# Patient Record
Sex: Female | Born: 1950 | Race: White | Hispanic: No | Marital: Married | Smoking: Never smoker
Health system: Southern US, Community
[De-identification: ages and names within clinical notes are randomized; demographics above are authoritative.]

## PROBLEM LIST (undated history)

## (undated) DIAGNOSIS — Z8489 Family history of other specified conditions: Secondary | ICD-10-CM

## (undated) DIAGNOSIS — F32A Depression, unspecified: Secondary | ICD-10-CM

## (undated) DIAGNOSIS — M25569 Pain in unspecified knee: Secondary | ICD-10-CM

## (undated) DIAGNOSIS — G4730 Sleep apnea, unspecified: Secondary | ICD-10-CM

## (undated) DIAGNOSIS — F329 Major depressive disorder, single episode, unspecified: Secondary | ICD-10-CM

## (undated) DIAGNOSIS — N62 Hypertrophy of breast: Secondary | ICD-10-CM

## (undated) DIAGNOSIS — I10 Essential (primary) hypertension: Secondary | ICD-10-CM

## (undated) DIAGNOSIS — R42 Dizziness and giddiness: Secondary | ICD-10-CM

## (undated) DIAGNOSIS — Z972 Presence of dental prosthetic device (complete) (partial): Secondary | ICD-10-CM

## (undated) DIAGNOSIS — R011 Cardiac murmur, unspecified: Secondary | ICD-10-CM

## (undated) DIAGNOSIS — F419 Anxiety disorder, unspecified: Secondary | ICD-10-CM

## (undated) HISTORY — DX: Pain in unspecified knee: M25.569

## (undated) HISTORY — PX: APPENDECTOMY: SHX54

## (undated) HISTORY — PX: CHOLECYSTECTOMY: SHX55

## (undated) HISTORY — PX: ABDOMINAL HYSTERECTOMY: SHX81

## (undated) HISTORY — PX: HERNIA REPAIR: SHX51

---

## 2009-08-16 ENCOUNTER — Emergency Department: Payer: Self-pay | Admitting: Emergency Medicine

## 2011-02-23 ENCOUNTER — Ambulatory Visit: Payer: Self-pay | Admitting: Specialist

## 2011-02-23 LAB — COMPREHENSIVE METABOLIC PANEL
Alkaline Phosphatase: 64 U/L (ref 50–136)
Anion Gap: 7 (ref 7–16)
Bilirubin,Total: 0.4 mg/dL (ref 0.2–1.0)
Calcium, Total: 9.3 mg/dL (ref 8.5–10.1)
Chloride: 105 mmol/L (ref 98–107)
Co2: 32 mmol/L (ref 21–32)
Creatinine: 1.02 mg/dL (ref 0.60–1.30)
EGFR (Non-African Amer.): 59 — ABNORMAL LOW
Osmolality: 291 (ref 275–301)
Potassium: 3.9 mmol/L (ref 3.5–5.1)
SGOT(AST): 22 U/L (ref 15–37)
Sodium: 144 mmol/L (ref 136–145)

## 2011-02-23 LAB — CBC WITH DIFFERENTIAL/PLATELET
Basophil %: 0.7 %
Eosinophil #: 0.1 10*3/uL (ref 0.0–0.7)
HCT: 39.7 % (ref 35.0–47.0)
HGB: 13.1 g/dL (ref 12.0–16.0)
Lymphocyte %: 35 %
MCHC: 33 g/dL (ref 32.0–36.0)
MCV: 88 fL (ref 80–100)
Monocyte #: 0.3 10*3/uL (ref 0.0–0.7)
Monocyte %: 5.5 %
Neutrophil #: 2.9 10*3/uL (ref 1.4–6.5)
Neutrophil %: 56.8 %
RBC: 4.5 10*6/uL (ref 3.80–5.20)
WBC: 5.1 10*3/uL (ref 3.6–11.0)

## 2011-02-23 LAB — TSH: Thyroid Stimulating Horm: 2.83 u[IU]/mL

## 2011-02-23 LAB — AMYLASE: Amylase: 26 U/L (ref 25–115)

## 2011-02-23 LAB — HEPATIC FUNCTION PANEL A (ARMC): Bilirubin, Direct: <0.1 mg/dL (ref 0.00–0.20)

## 2011-02-23 LAB — MAGNESIUM: Magnesium: 1.8 mg/dL

## 2011-02-23 LAB — PROTIME-INR: Prothrombin Time: 12.3 secs (ref 11.5–14.7)

## 2011-03-07 ENCOUNTER — Ambulatory Visit: Payer: Self-pay | Admitting: Specialist

## 2011-03-13 ENCOUNTER — Ambulatory Visit: Payer: Self-pay

## 2011-05-05 ENCOUNTER — Ambulatory Visit: Payer: Self-pay | Admitting: Specialist

## 2011-05-30 ENCOUNTER — Ambulatory Visit: Payer: Self-pay | Admitting: Specialist

## 2011-05-30 LAB — POTASSIUM: Potassium: 3.6 mmol/L (ref 3.5–5.1)

## 2011-06-20 ENCOUNTER — Inpatient Hospital Stay: Payer: Self-pay | Admitting: Specialist

## 2011-06-20 HISTORY — PX: GASTRIC BYPASS: SHX52

## 2011-06-21 LAB — CBC WITH DIFFERENTIAL/PLATELET
Eosinophil %: 0 %
MCH: 29.3 pg (ref 26.0–34.0)
MCV: 90 fL (ref 80–100)
Neutrophil #: 7.3 10*3/uL — ABNORMAL HIGH (ref 1.4–6.5)
Neutrophil %: 81.2 %
Platelet: 308 10*3/uL (ref 150–440)
RDW: 14.6 % — ABNORMAL HIGH (ref 11.5–14.5)
WBC: 9 10*3/uL (ref 3.6–11.0)

## 2011-06-21 LAB — BASIC METABOLIC PANEL
Anion Gap: 8 (ref 7–16)
Calcium, Total: 8.1 mg/dL — ABNORMAL LOW (ref 8.5–10.1)
Chloride: 106 mmol/L (ref 98–107)
Co2: 28 mmol/L (ref 21–32)
EGFR (African American): >60
EGFR (Non-African Amer.): 56 — ABNORMAL LOW
Glucose: 120 mg/dL — ABNORMAL HIGH (ref 65–99)
Osmolality: 283 (ref 275–301)
Potassium: 4.2 mmol/L (ref 3.5–5.1)

## 2011-06-21 LAB — MAGNESIUM: Magnesium: 1.6 mg/dL — ABNORMAL LOW

## 2011-06-21 LAB — PHOSPHORUS: Phosphorus: 3.2 mg/dL (ref 2.5–4.9)

## 2011-06-21 LAB — ALBUMIN: Albumin: 2.9 g/dL — ABNORMAL LOW (ref 3.4–5.0)

## 2011-06-22 LAB — MAGNESIUM: Magnesium: 1.7 mg/dL — ABNORMAL LOW

## 2011-06-22 LAB — CBC WITH DIFFERENTIAL/PLATELET
Eosinophil #: 0.1 10*3/uL (ref 0.0–0.7)
HGB: 11.5 g/dL — ABNORMAL LOW (ref 12.0–16.0)
Lymphocyte %: 20.7 %
MCH: 29.1 pg (ref 26.0–34.0)
MCHC: 32.6 g/dL (ref 32.0–36.0)
Monocyte #: 0.5 x10 3/mm (ref 0.2–0.9)
Monocyte %: 6.8 %
Platelet: 291 10*3/uL (ref 150–440)
RBC: 3.96 10*6/uL (ref 3.80–5.20)

## 2011-06-22 LAB — BASIC METABOLIC PANEL
Anion Gap: 8 (ref 7–16)
BUN: 6 mg/dL — ABNORMAL LOW (ref 7–18)
Calcium, Total: 8.4 mg/dL — ABNORMAL LOW (ref 8.5–10.1)
EGFR (Non-African Amer.): >60
Glucose: 102 mg/dL — ABNORMAL HIGH (ref 65–99)
Osmolality: 285 (ref 275–301)
Potassium: 3.4 mmol/L — ABNORMAL LOW (ref 3.5–5.1)

## 2011-06-22 LAB — PHOSPHORUS: Phosphorus: 2 mg/dL — ABNORMAL LOW (ref 2.5–4.9)

## 2011-07-17 ENCOUNTER — Ambulatory Visit: Payer: Self-pay | Admitting: Specialist

## 2011-08-17 ENCOUNTER — Ambulatory Visit: Payer: Self-pay | Admitting: Specialist

## 2012-07-26 ENCOUNTER — Ambulatory Visit: Payer: Self-pay | Admitting: Oncology

## 2012-08-01 ENCOUNTER — Ambulatory Visit: Payer: Self-pay | Admitting: Oncology

## 2012-08-01 LAB — CBC CANCER CENTER
Basophil #: 0.1 x10 3/mm (ref 0.0–0.1)
Eosinophil #: 0.1 x10 3/mm (ref 0.0–0.7)
HGB: 12.3 g/dL (ref 12.0–16.0)
MCH: 30.6 pg (ref 26.0–34.0)
MCHC: 33.4 g/dL (ref 32.0–36.0)
Monocyte #: 0.3 x10 3/mm (ref 0.2–0.9)
Monocyte %: 5 %
Neutrophil %: 54.8 %
Platelet: 283 x10 3/mm (ref 150–440)
RBC: 4.02 10*6/uL (ref 3.80–5.20)
RDW: 14.3 % (ref 11.5–14.5)

## 2012-08-01 LAB — IRON AND TIBC
Iron Bind.Cap.(Total): 301 ug/dL (ref 250–450)
Iron Saturation: 32 %
Iron: 97 ug/dL (ref 50–170)
Unbound Iron-Bind.Cap.: 204 ug/dL

## 2012-08-01 LAB — FERRITIN: Ferritin (ARMC): 163 ng/mL (ref 8–388)

## 2012-08-01 LAB — APTT: Activated PTT: 31.9 secs (ref 23.6–35.9)

## 2012-08-16 ENCOUNTER — Ambulatory Visit: Payer: Self-pay | Admitting: Oncology

## 2012-09-05 LAB — FERRITIN: Ferritin (ARMC): 124 ng/mL (ref 8–388)

## 2012-09-16 ENCOUNTER — Ambulatory Visit: Payer: Self-pay | Admitting: Oncology

## 2012-11-18 ENCOUNTER — Emergency Department: Payer: Self-pay | Admitting: Emergency Medicine

## 2012-11-18 LAB — CBC
HGB: 12.5 g/dL (ref 12.0–16.0)
Platelet: 262 10*3/uL (ref 150–440)
RBC: 4.03 10*6/uL (ref 3.80–5.20)
RDW: 14.3 % (ref 11.5–14.5)
WBC: 6 10*3/uL (ref 3.6–11.0)

## 2012-11-18 LAB — URINALYSIS, COMPLETE
Glucose,UR: NEGATIVE mg/dL (ref 0–75)
Nitrite: POSITIVE
Ph: 5 (ref 4.5–8.0)
RBC,UR: 3 /HPF (ref 0–5)
Specific Gravity: 1.024 (ref 1.003–1.030)
WBC UR: 5 /HPF (ref 0–5)

## 2012-11-18 LAB — COMPREHENSIVE METABOLIC PANEL
Alkaline Phosphatase: 92 U/L (ref 50–136)
Anion Gap: 3 — ABNORMAL LOW (ref 7–16)
Bilirubin,Total: 0.4 mg/dL (ref 0.2–1.0)
Calcium, Total: 9.4 mg/dL (ref 8.5–10.1)
Chloride: 111 mmol/L — ABNORMAL HIGH (ref 98–107)
Co2: 27 mmol/L (ref 21–32)
Creatinine: 1.04 mg/dL (ref 0.60–1.30)
EGFR (African American): >60
EGFR (Non-African Amer.): 58 — ABNORMAL LOW
Glucose: 96 mg/dL (ref 65–99)
Osmolality: 284 (ref 275–301)
SGOT(AST): 27 U/L (ref 15–37)
SGPT (ALT): 30 U/L (ref 12–78)
Sodium: 141 mmol/L (ref 136–145)
Total Protein: 6.9 g/dL (ref 6.4–8.2)

## 2012-12-02 ENCOUNTER — Ambulatory Visit: Payer: Self-pay | Admitting: Surgery

## 2012-12-10 ENCOUNTER — Ambulatory Visit: Payer: Self-pay | Admitting: Surgery

## 2014-05-08 NOTE — Op Note (Signed)
PATIENT NAME:  Jeanette [name], Jeanette [name] MR#:  [id number] DATE OF BIRTH:  09/11/1950  DATE OF PROCEDURE:  12/11/2012  PREOPERATIVE DIAGNOSIS: Incisional hernia.   POSTOPERATIVE DIAGNOSIS: Incisional hernia and umbilical hernia with incarcerated fat.   PROCEDURE PERFORMED: Incisional and umbilical hernia repair.   SURGEON: Marlyce Huge, MD  ESTIMATED BLOOD LOSS: 10 mL.   COMPLICATIONS: None.   SPECIMEN: Incarcerated omentum.   ANESTHESIA: General.   INDICATION FOR SURGERY: [name] is a pleasant, 64 year old female with history of cholecystectomy, who had a painful infraumbilical bulge that appeared consistent with a hernia. I thus brought her to the operating room suite for a hernia repair.   DETAILS OF PROCEDURE: Informed consent was obtained. [name] was brought to the operating room suite. She was induced. Endotracheal tube was placed, general anesthesia was administered. Her abdomen was then prepped and draped in a standard surgical fashion. A timeout was then performed correctly identify patient name, operative site and procedure to be performed. An infraumbilical incision was made from cranial to caudal. This was deepened down to the fascia. A hard infraumbilical hernia was felt with incarcerated omentum. This was slowly reduced with some resection of incarcerated omentum. There also appeared to be a more superior hernia, which was encountered and this appeared to be against her umbilicus. Her umbilicus was then separated off her abdominal fascia and there was a small umbilical hernia. The 2 defects were evaluated and noted to be side by side. They were then connected with the defect measuring approximately 1 cm. A Ventralex patch was then placed into the defect and the defect was closed with interrupted 0 Ethibond transverse sutures, which incorporated the tails of the mesh. The tails of the mesh were then cut off. The umbilicus was then tacked to the repair with a 3-0 Vicryl. The wound  was then irrigated and closed in 3 layers,  a 3-0 Vicryl, a deep dermal and a 4-0 Monocryl subcuticular. A sterile dressing was then placed over the wound. The patient was then awoken, extubated and brought to the postanesthesia care unit. There were no immediate complications. Needle, sponge, and instrument counts were correct at the end of the procedure.   ____________________________ Glena Norfolk. Paisyn Guercio, MD cal:aw D: 12/11/2012 07:48:40 ET T: 12/11/2012 08:29:10 ET JOB#: [id number]  cc: Harrell Gave A. Juanya Villavicencio, MD, <Dictator> Floyde Parkins MD ELECTRONICALLY SIGNED 12/14/2012 19:38

## 2014-05-10 NOTE — Op Note (Signed)
PATIENT NAME:  Jeanette [name], Jeanette [name] MR#:  [id number] DATE OF BIRTH:  20-Dec-1950  DATE OF PROCEDURE:  06/20/2011  PREOPERATIVE DIAGNOSES:  1. Morbid obesity.  2. Multiple comorbid factors.   POSTOPERATIVE DIAGNOSES:  1. Morbid obesity.  2. Multiple comorbid factors.   PROCEDURES:  1. Laparoscopic Roux-en-Y gastric bypass. 2. Hiatal hernia repair.  SURGEON:  Kreg Shropshire, MD  ASSISTANT: Valaria Good,  PA-C  CLINICAL HISTORY:  See History and Physical.  DETAILS OF THE PROCEDURE:  The patient was taken to the operating room and placed on the operating table in the supine position.  Appropriate monitors and supplemental oxygen were delivered.  Broad-spectrum IV antibiotics were administered.  Sequential stockings were placed.  The abdomen was prepped and draped in the usual sterile fashion after the smooth induction of general anesthesia.  The abdomen was accessed using a 5-ml optical trocar in the left upper quadrant.  Pneumoperitoneum was established without difficulty.  Multiple other ports were placed in preparation for gastric bypass.  The small bowel was identified at the ligament of Treitz and run for 50 cm distal to that point.  It was then divided using an Echelon white load stapler reinforced with seam guard.  The Harmonic scalpel was used to take down the mesentery to its base.  A 125 cm Roux limb was then created and a side-to-side jejunojejunostomy was created in the following fashion.  A tacking stitch was used using 2-0 Surgidac suture.  An enterotomy was made in both limbs and the Echelon white load stapler was inserted to its hub, clamped, and fired for its full 6-cm length.  The ensuing defect was closed using interrupted sutures to retract the enterotomy up and stapled across with the Echelon blue-load stapler with good effect.  A baby's butt stitch was placed times one using 2-0 Surgidac suture.  A stay suture was placed anteriorly using a 2-0 Surgidac suture and the mesenteric defect was  closed using running 2-0 Surgidac suture.  The omentum was divided then in the midline to create a path for the Roux limb to be lifted and elevated up to the gastric pouch.  The liver retractor was placed and the liver was retracted anteriorly and cephalad.  The patient was placed in steep reverse Trendelenburg after the Roux limb was tacked to the underside of the stomach using a 2-0 Surgidac suture.  All structures were removed from the stomach.  The pars flaccida was taken down using a Harmonic scalpel to the second gastric vein.  At that point the Echelon gold load reinforced with seam guard was used to fire multiple times creating a small 30 to 45-ml pouch.  Hemostasis was excellent.  The posterior pouch was then freed of all fibrofatty tissue in preparation for anastomosis.  The small bowel Roux limb was then mobilized up into the upper abdomen and tacked to the left side of the pouch with an interrupted 2-0 Surgidac suture.  Enterotomies were made in the gastric pouch and in the small bowel and the Echelon blue load stapler was inserted to 28 mm and clamped.  This was then fired and the ensuing defect was closed using a layer of 2-0 Polysorb suture times one.  A 34 French blunt-tipped bougie was inserted trans-orally down past the anastomosis and the second layer of closure occurred with two running 2-0 Polysorb sutures from either side, creating a two-layer anastomosis.  The bougie was removed and the proximal Roux limb was clamped.  This was submerged underneath saline and  endoscope was placed trans-orally and guided all the way down to the anastomosis without difficulty.  The anastomosis was widely patent and hemostasis was excellent.  No leak was present under high-pressure insufflation with the proximal Roux limb clamp.  The endoscopes were removed and I re-gowned and re-gloved.  We took the omentum and draped that over the top of the anastomosis and sutured it in place using a 2-0 Surgidac suture.  The  Peterson's defect was then closed using running 2-0 Surgidac suture. A drain was placed in the left upper quadrant over the anastomosis of [place].  The liver retractor and trocars were removed without incident.  No bleeding occurred from any of these sites.  The wounds were then closed using 4-0 Vicryl and Dermabond.  The patient tolerated the procedure well, arrived in the recovery room in stable condition, and will be admitted to my care.  ADDENDUM:  The hiatal hernia was repaired in posterior fashion by mobilizing the crura posteriorly and coapting this with interrupted 0 Surgidac suture with excellent effect. ____________________________ Kreg Shropshire, MD jb:slb D: 06/20/2011 18:26:59 ET     T: 06/21/2011 09:01:19 ET       JOB#: [id number] cc: Kreg Shropshire, MD, <Dictator> Wille Glaser Langley Adie MD ELECTRONICALLY SIGNED 06/22/2011 9:30

## 2014-07-07 ENCOUNTER — Encounter: Payer: Self-pay | Admitting: Emergency Medicine

## 2014-07-07 ENCOUNTER — Emergency Department
Admission: EM | Admit: 2014-07-07 | Discharge: 2014-07-07 | Disposition: A | Discharge status: Home / Self Care | Payer: No Typology Code available for payment source | Attending: Emergency Medicine | Admitting: Emergency Medicine

## 2014-07-07 DIAGNOSIS — I10 Essential (primary) hypertension: Secondary | ICD-10-CM | POA: Insufficient documentation

## 2014-07-07 DIAGNOSIS — Y92410 Unspecified street and highway as the place of occurrence of the external cause: Secondary | ICD-10-CM | POA: Diagnosis not present

## 2014-07-07 DIAGNOSIS — S6991XA Unspecified injury of right wrist, hand and finger(s), initial encounter: Secondary | ICD-10-CM | POA: Diagnosis present

## 2014-07-07 DIAGNOSIS — S8001XA Contusion of right knee, initial encounter: Secondary | ICD-10-CM | POA: Diagnosis not present

## 2014-07-07 DIAGNOSIS — Y998 Other external cause status: Secondary | ICD-10-CM | POA: Diagnosis not present

## 2014-07-07 DIAGNOSIS — Y9389 Activity, other specified: Secondary | ICD-10-CM | POA: Diagnosis not present

## 2014-07-07 DIAGNOSIS — S63501A Unspecified sprain of right wrist, initial encounter: Secondary | ICD-10-CM

## 2014-07-07 DIAGNOSIS — Z79899 Other long term (current) drug therapy: Secondary | ICD-10-CM | POA: Insufficient documentation

## 2014-07-07 HISTORY — DX: Essential (primary) hypertension: I10
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
## 2015-10-01 ENCOUNTER — Ambulatory Visit (INDEPENDENT_AMBULATORY_CARE_PROVIDER_SITE_OTHER): Payer: BLUE CROSS/BLUE SHIELD

## 2015-10-01 ENCOUNTER — Ambulatory Visit: Payer: BLUE CROSS/BLUE SHIELD

## 2015-10-01 ENCOUNTER — Encounter: Payer: Self-pay | Admitting: *Deleted

## 2015-10-01 ENCOUNTER — Ambulatory Visit
Admission: EM | Admit: 2015-10-01 | Discharge: 2015-10-01 | Disposition: A | Discharge status: Home / Self Care | Payer: BLUE CROSS/BLUE SHIELD | Attending: Internal Medicine | Admitting: Internal Medicine

## 2015-10-01 DIAGNOSIS — S8392XA Sprain of unspecified site of left knee, initial encounter: Secondary | ICD-10-CM

## 2015-10-01 DIAGNOSIS — S8002XA Contusion of left knee, initial encounter: Secondary | ICD-10-CM
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
## 2015-10-01 NOTE — ED Provider Notes (Signed)
CSN: [id number]     Arrival date & time 10/01/15  1507 History   First MD Initiated Contact with Patient 10/01/15 1547     Chief Complaint  Patient presents with  . Knee Pain  . Leg Injury   (Consider location/radiation/quality/duration/timing/severity/associated sxs/prior Treatment) HPI History obtained from patient:  Pt presents with the cc of:  Left knee injury Duration of symptoms: 2 weeks Treatment prior to arrival: Ice and elevation and heat occasional Tylenol or ibuprofen Context: Patient stepped into a hole about 2 weeks ago twisted her left knee. Other symptoms include: Pain to walk and bruising of the lower leg Pain score: 3 FAMILY HISTORY: Hypertension    Past Medical History:  Diagnosis Date  . Hypertension    Past Surgical History:  Procedure Laterality Date  . ABDOMINAL HYSTERECTOMY    . APPENDECTOMY    . CHOLECYSTECTOMY    . GASTRIC BYPASS    . HERNIA REPAIR     History reviewed. No pertinent family history. Social History  Substance Use Topics  . Smoking status: Never Smoker  . Smokeless tobacco: Never Used  . Alcohol use No   OB History    No data available     Review of Systems  Denies: HEADACHE, NAUSEA, ABDOMINAL PAIN, CHEST PAIN, CONGESTION, DYSURIA, SHORTNESS OF BREATH  Allergies  Review of patient's allergies indicates no known allergies.  Home Medications   Prior to Admission medications   Medication Sig Start Date End Date Taking? Authorizing Provider  sertraline (ZOLOFT) 25 MG tablet Take 25 mg by mouth daily.   Yes Historical Provider, MD   Meds Ordered and Administered this Visit  Medications - No data to display  BP (!) 181/99 (BP Location: Right Arm)   Pulse 85   Temp 98.2 F (36.8 C) (Oral)   Resp 16   Ht 5\' 6"  (1.676 m)   Wt 205 lb (93 kg)   SpO2 96%   BMI 33.09 kg/m  No data found.   Physical Exam NURSES NOTES AND VITAL SIGNS REVIEWED. CONSTITUTIONAL: Well developed, well nourished, no acute distress HEENT:  normocephalic, atraumatic EYES: Conjunctiva normal NECK:normal ROM, supple, no adenopathy PULMONARY:No respiratory distress, normal effort ABDOMINAL: Soft, ND, NT BS+, No CVAT MUSCULOSKELETAL: Normal ROM of all extremities except left knee. Swollen, bruised,   SKIN: warm and dry without rash PSYCHIATRIC: Mood and affect, behavior are normal  Urgent Care Course   Clinical Course    Procedures (including critical care time)  Labs Review Labs Reviewed - No data to display  Imaging Review Dg Knee Complete 4 Views Left  Result Date: 10/01/2015 CLINICAL DATA:  Patient stepped into a hole 2 weeks prior. Anterior knee pain. Initial encounter. EXAM: LEFT KNEE - COMPLETE 4+ VIEW COMPARISON:  None. FINDINGS: Normal anatomic alignment. No evidence for acute fracture or dislocation. No joint effusion. Regional soft tissues are unremarkable. IMPRESSION: No acute osseous abnormality. Electronically Signed   By: Lovey Newcomer M.D.   On: 10/01/2015 16:16    Reviewed with patient and her husband prior to discharge.  Visual Acuity Review  Right Eye Distance:   Left Eye Distance:   Bilateral Distance:    Right Eye Near:   Left Eye Near:    Bilateral Near:         MDM   1. Left knee sprain, initial encounter   2. Knee contusion, left, initial encounter     Patient is reassured that there are no issues that require transfer to higher level of  care at this time or additional tests. Patient is advised to continue home symptomatic treatment. Patient is advised that if there are new or worsening symptoms to attend the emergency department, contact primary care provider, or return to UC. Instructions of care provided discharged home in stable condition.    THIS NOTE WAS GENERATED USING A VOICE RECOGNITION SOFTWARE PROGRAM. ALL REASONABLE EFFORTS  WERE MADE TO PROOFREAD THIS DOCUMENT FOR ACCURACY.  I have verbally reviewed the discharge instructions with the patient. A printed AVS was given  to the patient.  All questions were answered prior to discharge.      Konrad Felix, [place] 10/01/15 [phone]

## 2015-10-01 NOTE — Discharge Instructions (Signed)
THERE IS NO SIGNS OF FRACTURE IN YOUR LEG.  YOU CAN START USING MOIST HEAT.  CONTINUE ACTIVITY AS TOLERATED.   FOLLOW UP WITH DR. Mack Guise IF NOT STEADILY GETTING BETTER IT MAY TAKE SEVERAL WEEKS FOR THE BRUISING TO RESOLVE.  IF THERE IS ANY CHANGE TO XRAY READING THAT CHANGES MANAGEMENT WE WILL CALL YOU TO DISCUSS.

## 2015-12-20 ENCOUNTER — Ambulatory Visit: Payer: Self-pay | Admitting: Family Medicine

## 2016-06-23 ENCOUNTER — Ambulatory Visit: Payer: Self-pay | Admitting: Family Medicine

## 2016-07-24 ENCOUNTER — Encounter: Payer: Self-pay | Admitting: Family Medicine

## 2016-07-24 ENCOUNTER — Ambulatory Visit (INDEPENDENT_AMBULATORY_CARE_PROVIDER_SITE_OTHER): Payer: BLUE CROSS/BLUE SHIELD | Admitting: Family Medicine

## 2016-07-24 VITALS — BP 132/82 | HR 75 | Resp 16 | Ht 66.0 in | Wt 220.0 lb

## 2016-07-24 DIAGNOSIS — F32A Depression, unspecified: Secondary | ICD-10-CM | POA: Insufficient documentation

## 2016-07-24 DIAGNOSIS — E669 Obesity, unspecified: Secondary | ICD-10-CM

## 2016-07-24 DIAGNOSIS — L509 Urticaria, unspecified: Secondary | ICD-10-CM | POA: Diagnosis not present

## 2016-07-24 DIAGNOSIS — K644 Residual hemorrhoidal skin tags: Secondary | ICD-10-CM

## 2016-07-24 DIAGNOSIS — R2681 Unsteadiness on feet: Secondary | ICD-10-CM

## 2016-07-24 DIAGNOSIS — Z1231 Encounter for screening mammogram for malignant neoplasm of breast: Secondary | ICD-10-CM | POA: Diagnosis not present

## 2016-07-24 DIAGNOSIS — F3342 Major depressive disorder, recurrent, in full remission: Secondary | ICD-10-CM

## 2016-07-24 DIAGNOSIS — Z1211 Encounter for screening for malignant neoplasm of colon: Secondary | ICD-10-CM

## 2016-07-24 DIAGNOSIS — R197 Diarrhea, unspecified: Secondary | ICD-10-CM | POA: Diagnosis not present

## 2016-07-24 DIAGNOSIS — Z1239 Encounter for other screening for malignant neoplasm of breast: Secondary | ICD-10-CM

## 2016-07-24 DIAGNOSIS — Z9884 Bariatric surgery status: Secondary | ICD-10-CM

## 2016-07-24 DIAGNOSIS — Z23 Encounter for immunization: Secondary | ICD-10-CM

## 2016-07-24 DIAGNOSIS — F329 Major depressive disorder, single episode, unspecified: Secondary | ICD-10-CM | POA: Insufficient documentation

## 2016-07-24 MED ORDER — ZOSTER VAC RECOMB ADJUVANTED 50 MCG/0.5ML IM SUSR: 0.5000 mL | Freq: Once | INTRAMUSCULAR | 1 refills | Status: AC

## 2016-07-24 MED ORDER — HYDROCORTISONE 2.5 % RE CREA: 1.0000 "application " | TOPICAL_CREAM | Freq: Two times a day (BID) | RECTAL | 2 refills | Status: DC | PRN

## 2016-07-24 MED ORDER — SERTRALINE HCL 50 MG PO TABS: 50.0000 mg | ORAL_TABLET | Freq: Every day | ORAL | 3 refills | Status: DC

## 2016-07-24 NOTE — Progress Notes (Signed)
Date:  07/24/2016   Name:  Jeanette [name]   DOB:  [birth year]   MRN:  [id number]  PCP:  Adline Potter, MD    Chief Complaint: Establish Care; Leg Swelling (Right leg ); Edema (Right arm in elbow area swelling ); and Hot Flashes   History of Present Illness:  This is a 66 y.o. female seen for initial visit. S/p hysterectomy, appy, GB surgery, gastric bypass 2013. Lost 150# but has regained 25, on salad diet with protein shake. Hx HTN off meds since bypass. C/o frequent diarrhea since GB surgery. On Zoloft x ys for depression/anxiety, feels working well. Claritin daily controls urticaria. Has ext hemorrhoids, will discuss with GI. Occ RLE edema improves overnight. Father died stomach ca 96, mother died sepsis 49, half brother died throat ca 13, half brother with COPD (smoker). Hx colon polyps no colonoscopy in over 10 yrs, no mammogram in over 10 yrs, Pap 3 yrs ago normal, none abnormal. Tdap 2011, no pneumo or zoster imms.  Review of Systems:  Review of Systems  Constitutional: Negative for chills and fever.  HENT: Negative for ear pain, sinus pain, sore throat and trouble swallowing.   Eyes: Negative for pain.  Respiratory: Negative for cough and shortness of breath.   Cardiovascular: Negative for chest pain and palpitations.  Gastrointestinal: Negative for abdominal pain.  Endocrine: Negative for polydipsia and polyuria.  Genitourinary: Negative for difficulty urinating and vaginal bleeding.  Musculoskeletal: Negative for joint swelling.  Neurological: Negative for syncope and light-headedness.  Hematological: Negative for adenopathy.    Patient Active Problem List   Diagnosis Date Noted  . Depression 07/24/2016  . History of gastric bypass 07/24/2016  . Gait instability 07/24/2016  . Diarrhea 07/24/2016  . Urticaria 07/24/2016  . External hemorrhoids 07/24/2016  . Obesity (BMI 30-39.9) 07/24/2016    Prior to Admission medications   Medication Sig Start Date End Date Taking?  Authorizing Provider  Loperamide HCl (ANTI-DIARRHEAL PO) Take by mouth.   Yes [provider]  loratadine (ALLERGY) 10 MG tablet Take 10 mg by mouth daily.   Yes [provider]  Multiple Vitamins-Minerals (CENTRUM SILVER PO) Take by mouth.   Yes [provider]  sertraline (ZOLOFT) 50 MG tablet Take 1 tablet (50 mg total) by mouth daily. 07/24/16  Yes Armya Westerhoff, Gwyndolyn Saxon, MD  hydrocortisone (ANUSOL-HC) 2.5 % rectal cream Place 1 application rectally 2 (two) times daily as needed for hemorrhoids or anal itching. 07/24/16   Carisma Troupe, Gwyndolyn Saxon, MD  Zoster Vac Recomb Adjuvanted [facility]) injection Inject 0.5 mLs into the muscle once. 07/24/16 07/24/16  Adline Potter, MD    No Known Allergies  Past Surgical History:  Procedure Laterality Date  . ABDOMINAL HYSTERECTOMY    . APPENDECTOMY    . CHOLECYSTECTOMY    . GASTRIC BYPASS    . HERNIA REPAIR      Social History  Substance Use Topics  . Smoking status: Never Smoker  . Smokeless tobacco: Never Used  . Alcohol use No    Family History  Problem Relation Age of Onset  . Cancer Father        stomach    Medication list has been reviewed and updated.  Physical Examination: BP 132/82 (BP Location: Left Wrist, Patient Position: Sitting, Cuff Size: Normal)   Pulse 75   Resp 16   Ht 5\' 6"  (1.676 m)   Wt 220 lb (99.8 kg)   SpO2 95%   BMI 35.51 kg/m   Physical Exam  Constitutional: She is  oriented to person, place, and time. She appears well-developed and well-nourished.  HENT:  Head: Normocephalic and atraumatic.  Left Ear: External ear normal.  Nose: Nose normal.  Mouth/Throat: Oropharynx is clear and moist.  TMs clear  Eyes: Conjunctivae and EOM are normal. Pupils are equal, round, and reactive to light.  Neck: Neck supple. No thyromegaly present.  Cardiovascular: Normal rate, regular rhythm, normal heart sounds and intact distal pulses.   Pulmonary/Chest: Effort normal and breath sounds normal.  Abdominal:  Soft. She exhibits no distension and no mass. There is no tenderness.  Musculoskeletal: She exhibits no edema.  Lymphadenopathy:    She has no cervical adenopathy.  Neurological: She is alert and oriented to person, place, and time. Coordination normal.  Romberg and gait sl unsteady SLUMS 28/30  Skin: Skin is warm and dry.  Psychiatric: She has a normal mood and affect. Her behavior is normal.  GDS 2/15  Nursing note and vitals reviewed.   Assessment and Plan:  1. Recurrent major depressive disorder, in full remission (HCC) Refill Zoloft 50 mg daily - Comprehensive Metabolic Panel (CMET) - CBC - TSH - B12 - Hepatitis C Antibody  2. Gait instability Check labs, consider PT referral  3. External hemorrhoids Anusol HC cream prn, GI to eval  4. Urticaria Well controlled on Claritin daily  5. Diarrhea, unspecified type Cont Lomotil prn, Gi to eval  6. Obesity (BMI 30-39.9) S/p gastric bypass, exercise/weight loss diacussed - Lipid Profile - Vitamin D (25 hydroxy)  7. History of gastric bypass Cont mvit  8. Screening for breast cancer - MM Digital Diagnostic Bilat; Future  9. Screening for colon cancer - Ambulatory referral to Gastroenterology  10. Need for pneumococcal vaccination - Pneumococcal conjugate vaccine 13-valent  11. Need for zoster vaccination - Zoster Vac Recomb Adjuvanted [facility]) injection; Inject 0.5 mLs into the muscle once.  Dispense: 0.5 mL; Refill: 1  Return in about 4 weeks (around 08/21/2016).   45 mins spent with pt over half in counseling  Satira Anis. [facility]  07/24/2016

## 2016-07-25 ENCOUNTER — Encounter: Payer: Self-pay | Admitting: Family Medicine

## 2016-07-25 ENCOUNTER — Other Ambulatory Visit: Payer: Self-pay | Admitting: Family Medicine

## 2016-07-25 DIAGNOSIS — E559 Vitamin D deficiency, unspecified: Secondary | ICD-10-CM | POA: Insufficient documentation

## 2016-07-25 LAB — COMPREHENSIVE METABOLIC PANEL
ALT: 17 IU/L (ref 0–32)
AST: 24 IU/L (ref 0–40)
Albumin/Globulin Ratio: 1.6 (ref 1.2–2.2)
Albumin: 4.3 g/dL (ref 3.6–4.8)
Alkaline Phosphatase: 93 IU/L (ref 39–117)
BUN/Creatinine Ratio: 36 — ABNORMAL HIGH (ref 12–28)
BUN: 30 mg/dL — ABNORMAL HIGH (ref 8–27)
Bilirubin Total: 0.4 mg/dL (ref 0.0–1.2)
CALCIUM: 9.5 mg/dL (ref 8.7–10.3)
CO2: 22 mmol/L (ref 20–29)
CREATININE: 0.84 mg/dL (ref 0.57–1.00)
Chloride: 104 mmol/L (ref 96–106)
GFR calc Af Amer: 85 mL/min/{1.73_m2} (ref >59)
GFR calc non Af Amer: 74 mL/min/{1.73_m2} (ref >59)
GLOBULIN, TOTAL: 2.7 g/dL (ref 1.5–4.5)
Glucose: 88 mg/dL (ref 65–99)
Potassium: 4.2 mmol/L (ref 3.5–5.2)
Sodium: 143 mmol/L (ref 134–144)
TOTAL PROTEIN: 7 g/dL (ref 6.0–8.5)

## 2016-07-25 LAB — CBC
HEMOGLOBIN: 13.7 g/dL (ref 11.1–15.9)
Hematocrit: 42 % (ref 34.0–46.6)
MCH: 29.9 pg (ref 26.6–33.0)
MCHC: 32.6 g/dL (ref 31.5–35.7)
MCV: 92 fL (ref 79–97)
Platelets: 294 10*3/uL (ref 150–379)
RBC: 4.58 x10E6/uL (ref 3.77–5.28)
RDW: 14.5 % (ref 12.3–15.4)
WBC: 6.2 10*3/uL (ref 3.4–10.8)

## 2016-07-25 LAB — LIPID PANEL
CHOL/HDL RATIO: 2.3 ratio (ref 0.0–4.4)
Cholesterol, Total: 183 mg/dL (ref 100–199)
HDL: 80 mg/dL (ref >39)
LDL CALC: 87 mg/dL (ref 0–99)
Triglycerides: 82 mg/dL (ref 0–149)
VLDL CHOLESTEROL CAL: 16 mg/dL (ref 5–40)

## 2016-07-25 LAB — TSH: TSH: 3.14 u[IU]/mL (ref 0.450–4.500)

## 2016-07-25 LAB — HEPATITIS C ANTIBODY: Hep C Virus Ab: <0.1 s/co ratio (ref 0.0–0.9)

## 2016-07-25 LAB — VITAMIN B12: VITAMIN B 12: 376 pg/mL (ref 232–1245)

## 2016-07-25 LAB — VITAMIN D 25 HYDROXY (VIT D DEFICIENCY, FRACTURES): Vit D, 25-Hydroxy: 24.7 ng/mL — ABNORMAL LOW (ref 30.0–100.0)

## 2016-07-25 MED ORDER — VITAMIN D3 25 MCG (1000 UT) PO CAPS: 1.0000 | ORAL_CAPSULE | Freq: Every day | ORAL | Status: DC

## 2016-07-25 NOTE — Progress Notes (Signed)
Vit D level low, begin supplement

## 2016-07-27 ENCOUNTER — Encounter: Payer: Self-pay | Admitting: Family Medicine

## 2016-07-27 NOTE — Telephone Encounter (Signed)
Pt question

## 2016-08-10 ENCOUNTER — Other Ambulatory Visit: Payer: Self-pay

## 2016-08-10 ENCOUNTER — Telehealth: Payer: Self-pay

## 2016-08-10 DIAGNOSIS — Z1211 Encounter for screening for malignant neoplasm of colon: Secondary | ICD-10-CM

## 2016-08-10 DIAGNOSIS — Z86010 Personal history of colonic polyps: Secondary | ICD-10-CM

## 2016-08-10 DIAGNOSIS — Z800 Family history of malignant neoplasm of digestive organs: Secondary | ICD-10-CM

## 2016-08-10 NOTE — Telephone Encounter (Signed)
Gastroenterology Pre-Procedure Review  Request Date: 09/04/16 Requesting Physician: Dr. Allen Norris  PATIENT REVIEW QUESTIONS: The patient responded to the following health history questions as indicated:    1. Are you having any GI issues? yes (Hemrrhoids) 2. Do you have a personal history of Polyps? yes (doesnt recall last colonoscopy) 3. Do you have a family history of Colon Cancer or Polyps? yes (dad colon cancer) 4. Diabetes Mellitus? no 5. Joint replacements in the past 12 months?no 6. Major health problems in the past 3 months?no 7. Any artificial heart valves, MVP, or defibrillator?no    MEDICATIONS & ALLERGIES:    Patient reports the following regarding taking any anticoagulation/antiplatelet therapy:   Plavix, Coumadin, Eliquis, Xarelto, Lovenox, Pradaxa, Brilinta, or Effient? no Aspirin? no  Patient confirms/reports the following medications:  Current Outpatient Prescriptions  Medication Sig Dispense Refill  . Cholecalciferol (VITAMIN D3) 1000 units CAPS Take 1 capsule (1,000 Units total) by mouth daily. 60 capsule   . hydrocortisone (ANUSOL-HC) 2.5 % rectal cream Place 1 application rectally 2 (two) times daily as needed for hemorrhoids or anal itching. 30 g 2  . Loperamide HCl (ANTI-DIARRHEAL PO) Take by mouth.    . loratadine (ALLERGY) 10 MG tablet Take 10 mg by mouth daily.    . Multiple Vitamins-Minerals (CENTRUM SILVER PO) Take by mouth.    . sertraline (ZOLOFT) 50 MG tablet Take 1 tablet (50 mg total) by mouth daily. 90 tablet 3   No current facility-administered medications for this visit.     Patient confirms/reports the following allergies:  No Known Allergies  No orders of the defined types were placed in this encounter.   AUTHORIZATION INFORMATION Primary Insurance: 1D#: Group #:  Secondary Insurance: 1D#: Group #:  SCHEDULE INFORMATION: Date: 09/04/16 Time: Location:[facility]

## 2016-08-25 ENCOUNTER — Ambulatory Visit (INDEPENDENT_AMBULATORY_CARE_PROVIDER_SITE_OTHER): Payer: PPO | Admitting: Family Medicine

## 2016-08-25 ENCOUNTER — Encounter: Payer: Self-pay | Admitting: Family Medicine

## 2016-08-25 VITALS — BP 132/86 | HR 74 | Resp 16 | Ht 66.0 in | Wt 213.0 lb

## 2016-08-25 DIAGNOSIS — L509 Urticaria, unspecified: Secondary | ICD-10-CM | POA: Diagnosis not present

## 2016-08-25 DIAGNOSIS — E559 Vitamin D deficiency, unspecified: Secondary | ICD-10-CM

## 2016-08-25 DIAGNOSIS — E669 Obesity, unspecified: Secondary | ICD-10-CM | POA: Diagnosis not present

## 2016-08-25 DIAGNOSIS — F3341 Major depressive disorder, recurrent, in partial remission: Secondary | ICD-10-CM

## 2016-08-25 DIAGNOSIS — K644 Residual hemorrhoidal skin tags: Secondary | ICD-10-CM

## 2016-08-25 DIAGNOSIS — R2681 Unsteadiness on feet: Secondary | ICD-10-CM

## 2016-08-25 MED ORDER — SERTRALINE HCL 100 MG PO TABS: 100.0000 mg | ORAL_TABLET | Freq: Every day | ORAL | 3 refills | Status: DC

## 2016-08-25 NOTE — Progress Notes (Signed)
Date:  08/25/2016   Name:  Jeanette [name]   DOB:  [birth year]   MRN:  [id number]  PCP:  Adline Potter, MD    Chief Complaint: Depression (Feeling good no depression symptoms ) and Jaw Pain (radiates on L and R side down neck and sometimes back. )   History of Present Illness:  This is a 66 y.o. female seen in one month f/u from initial visit. Feels Zoloft dose too low and would like to increase, several recent deaths in family. Walking more, doesn't think she needs to see PT for balance training, no recent falls. Claritin daily controlled urticaria. Seeing GI 09/04/16 for ext hemorrhoids and colonoscopy. Weight down 7#. C/o two episodes jaw pain over past two weeks while eating, lasted less than a minute, non-exertional. Gets lightheaded occasionally when standing x yrs. Taking vit D supp.  Review of Systems:  Review of Systems  Constitutional: Negative for chills and fever.  HENT: Negative for ear pain, facial swelling and sore throat.   Respiratory: Negative for cough and shortness of breath.   Cardiovascular: Negative for chest pain and leg swelling.  Gastrointestinal: Negative for nausea.  Neurological: Negative for syncope.    Patient Active Problem List   Diagnosis Date Noted  . Vitamin D deficiency 07/25/2016  . Depression 07/24/2016  . History of gastric bypass 07/24/2016  . Gait instability 07/24/2016  . Diarrhea 07/24/2016  . Urticaria 07/24/2016  . External hemorrhoids 07/24/2016  . Obesity (BMI 30-39.9) 07/24/2016    Prior to Admission medications   Medication Sig Start Date End Date Taking? Authorizing Provider  Cholecalciferol (VITAMIN D3) 1000 units CAPS Take 1 capsule (1,000 Units total) by mouth daily. 07/25/16  Yes Elody Kleinsasser, Gwyndolyn Saxon, MD  hydrocortisone (ANUSOL-HC) 2.5 % rectal cream Place 1 application rectally 2 (two) times daily as needed for hemorrhoids or anal itching. 07/24/16  Yes Kenae Lindquist, Gwyndolyn Saxon, MD  Loperamide HCl (ANTI-DIARRHEAL PO) Take by mouth.   Yes  [provider]  loratadine (ALLERGY) 10 MG tablet Take 10 mg by mouth daily.   Yes [provider]  Multiple Vitamins-Minerals (CENTRUM SILVER PO) Take by mouth.   Yes [provider]  sertraline (ZOLOFT) 100 MG tablet Take 1 tablet (100 mg total) by mouth daily. 08/25/16  Yes Quin Mathenia, Gwyndolyn Saxon, MD    No Known Allergies  Past Surgical History:  Procedure Laterality Date  . ABDOMINAL HYSTERECTOMY    . APPENDECTOMY    . CHOLECYSTECTOMY    . GASTRIC BYPASS    . HERNIA REPAIR      Social History  Substance Use Topics  . Smoking status: Never Smoker  . Smokeless tobacco: Never Used  . Alcohol use No    Family History  Problem Relation Age of Onset  . Cancer Father        stomach    Medication list has been reviewed and updated.  Physical Examination: BP 132/86   Pulse 74   Resp 16   Ht 5\' 6"  (1.676 m)   Wt 213 lb (96.6 kg)   SpO2 96%   BMI 34.38 kg/m   Physical Exam  Constitutional: She appears well-developed and well-nourished.  HENT:  Mouth/Throat: Oropharynx is clear and moist.  No TMJ tenderness  Cardiovascular: Normal rate, regular rhythm and normal heart sounds.   Pulmonary/Chest: Effort normal and breath sounds normal.  Musculoskeletal: She exhibits no edema.  Neurological: She is alert.  Skin: Skin is warm and dry.  Psychiatric: Her behavior is normal.  Flat affect  Nursing note and vitals reviewed.   Assessment and Plan:  1. Recurrent major depressive disorder, in partial remission (HCC) Increase Zoloft to 100 mg daily, rx sent  2. Obesity (BMI 30-39.9) Weight down 7#, continue exercise/weight loss  3. Urticaria Well controlled on Claritin  4. External hemorrhoids Cont Anusol HC prn, GI to eval 09/04/16  5. Gait instability Feels improved with walking, declines PT referral  6. Vitamin D deficiency On supplement, consider recheck level next visit  Return in about 6 months (around 02/25/2017).  Satira Anis. [facility]  08/25/2016

## 2016-08-28 ENCOUNTER — Encounter: Payer: Self-pay | Admitting: *Deleted

## 2016-09-01 NOTE — Discharge Instructions (Signed)
General Anesthesia, Adult, Care After °These instructions provide you with information about caring for yourself after your procedure. Your health care provider may also give you more specific instructions. Your treatment has been planned according to current medical practices, but problems sometimes occur. Call your health care provider if you have any problems or questions after your procedure. °What can I expect after the procedure? °After the procedure, it is common to have: °· Vomiting. °· A sore throat. °· Mental slowness. ° °It is common to feel: °· Nauseous. °· Cold or shivery. °· Sleepy. °· Tired. °· Sore or achy, even in parts of your body where you did not have surgery. ° °Follow these instructions at home: °For at least 24 hours after the procedure: °· Do not: °? Participate in activities where you could fall or become injured. °? Drive. °? Use heavy machinery. °? Drink alcohol. °? Take sleeping pills or medicines that cause drowsiness. °? Make important decisions or sign legal documents. °? Take care of children on your own. °· Rest. °Eating and drinking °· If you vomit, drink water, juice, or soup when you can drink without vomiting. °· Drink enough fluid to keep your urine clear or pale yellow. °· Make sure you have [place] or no nausea before eating solid foods. °· Follow the diet recommended by your health care provider. °General instructions °· Have a responsible adult stay with you until you are awake and alert. °· Return to your normal activities as told by your health care provider. Ask your health care provider what activities are safe for you. °· Take over-the-counter and prescription medicines only as told by your health care provider. °· If you smoke, do not smoke without supervision. °· Keep all follow-up visits as told by your health care provider. This is important. °Contact a health care provider if: °· You continue to have nausea or vomiting at home, and medicines are not helpful. °· You  cannot drink fluids or start eating again. °· You cannot urinate after 8-12 hours. °· You develop a skin rash. °· You have fever. °· You have increasing redness at the site of your procedure. °Get help right away if: °· You have difficulty breathing. °· You have chest pain. °· You have unexpected bleeding. °· You feel that you are having a life-threatening or urgent problem. °This information is not intended to replace advice given to you by your health care provider. Make sure you discuss any questions you have with your health care provider. °Document Released: 04/10/2000 Document Revised: 06/07/2015 Document Reviewed: 12/17/2014 °Elsevier Interactive Patient Education © 2018 [employer] Inc[employer] ° °

## 2016-09-04 ENCOUNTER — Ambulatory Visit: Payer: PPO | Admitting: Anesthesiology

## 2016-09-04 ENCOUNTER — Encounter
Admission: RE | Disposition: A | Discharge status: Home / Self Care | Payer: Self-pay | Source: Ambulatory Visit | Attending: Gastroenterology

## 2016-09-04 ENCOUNTER — Ambulatory Visit
Admission: RE | Admit: 2016-09-04 | Discharge: 2016-09-04 | Disposition: A | Discharge status: Home / Self Care | Payer: PPO | Source: Ambulatory Visit | Attending: Gastroenterology | Admitting: Gastroenterology

## 2016-09-04 DIAGNOSIS — D120 Benign neoplasm of cecum: Secondary | ICD-10-CM | POA: Diagnosis not present

## 2016-09-04 DIAGNOSIS — Z79899 Other long term (current) drug therapy: Secondary | ICD-10-CM | POA: Insufficient documentation

## 2016-09-04 DIAGNOSIS — Z800 Family history of malignant neoplasm of digestive organs: Secondary | ICD-10-CM | POA: Diagnosis not present

## 2016-09-04 DIAGNOSIS — Z90710 Acquired absence of both cervix and uterus: Secondary | ICD-10-CM | POA: Diagnosis not present

## 2016-09-04 DIAGNOSIS — D490 Neoplasm of unspecified behavior of digestive system: Secondary | ICD-10-CM | POA: Diagnosis not present

## 2016-09-04 DIAGNOSIS — Z9884 Bariatric surgery status: Secondary | ICD-10-CM | POA: Insufficient documentation

## 2016-09-04 DIAGNOSIS — K5730 Diverticulosis of large intestine without perforation or abscess without bleeding: Secondary | ICD-10-CM | POA: Diagnosis not present

## 2016-09-04 DIAGNOSIS — K641 Second degree hemorrhoids: Secondary | ICD-10-CM | POA: Diagnosis not present

## 2016-09-04 DIAGNOSIS — Z9049 Acquired absence of other specified parts of digestive tract: Secondary | ICD-10-CM | POA: Insufficient documentation

## 2016-09-04 DIAGNOSIS — Z9889 Other specified postprocedural states: Secondary | ICD-10-CM | POA: Insufficient documentation

## 2016-09-04 DIAGNOSIS — Z1211 Encounter for screening for malignant neoplasm of colon: Secondary | ICD-10-CM

## 2016-09-04 DIAGNOSIS — D124 Benign neoplasm of descending colon: Secondary | ICD-10-CM

## 2016-09-04 HISTORY — DX: Dizziness and giddiness: R42

## 2016-09-04 HISTORY — PX: POLYPECTOMY: SHX5525

## 2016-09-04 HISTORY — DX: Presence of dental prosthetic device (complete) (partial): Z97.2

## 2016-09-04 HISTORY — DX: Family history of other specified conditions: Z84.89

## 2016-09-04 HISTORY — DX: Cardiac murmur, unspecified: R01.1

## 2016-09-04 HISTORY — PX: COLONOSCOPY WITH PROPOFOL: SHX5780

## 2016-09-04 SURGERY — COLONOSCOPY WITH PROPOFOL
Anesthesia: General

## 2016-09-04 MED ORDER — LIDOCAINE HCL (CARDIAC) 20 MG/ML IV SOLN
INTRAVENOUS | Status: DC | PRN
  Administered 2016-09-04: 50 mg via INTRAVENOUS

## 2016-09-04 MED ORDER — STERILE WATER FOR IRRIGATION IR SOLN
Status: DC | PRN
  Administered 2016-09-04: 11:00:00

## 2016-09-04 MED ORDER — PROPOFOL 10 MG/ML IV BOLUS
INTRAVENOUS | Status: DC | PRN
  Administered 2016-09-04: 50 mg via INTRAVENOUS
  Administered 2016-09-04: 30 mg via INTRAVENOUS
  Administered 2016-09-04: 100 mg via INTRAVENOUS
  Administered 2016-09-04: 20 mg via INTRAVENOUS
  Administered 2016-09-04: 50 mg via INTRAVENOUS
  Administered 2016-09-04: 30 mg via INTRAVENOUS
  Administered 2016-09-04: 20 mg via INTRAVENOUS

## 2016-09-04 MED ORDER — ONDANSETRON HCL 4 MG/2ML IJ SOLN: 4.0000 mg | Freq: Once | INTRAMUSCULAR | Status: DC | PRN

## 2016-09-04 MED ORDER — LACTATED RINGERS IV SOLN
INTRAVENOUS | Status: DC
  Administered 2016-09-04: 10:00:00 via INTRAVENOUS

## 2016-09-04 SURGICAL SUPPLY — 23 items
CANISTER SUCT 1200ML W/VALVE (MISCELLANEOUS) ×3 IMPLANT
CLIP HMST 235XBRD CATH ROT (MISCELLANEOUS) ×2 IMPLANT
CLIP RESOLUTION 360 11X235 (MISCELLANEOUS) ×1
FCP ESCP3.2XJMB 240X2.8X (MISCELLANEOUS)
FORCEPS BIOP RAD 4 LRG CAP 4 (CUTTING FORCEPS) ×3 IMPLANT
FORCEPS BIOP RJ4 240 W/NDL (MISCELLANEOUS)
FORCEPS ESCP3.2XJMB 240X2.8X (MISCELLANEOUS) IMPLANT
GOWN CVR UNV OPN BCK APRN NK (MISCELLANEOUS) ×4 IMPLANT
GOWN ISOL THUMB LOOP REG UNIV (MISCELLANEOUS) ×2
INJECTOR VARIJECT VIN23 (MISCELLANEOUS) IMPLANT
KIT DEFENDO VALVE AND CONN (KITS) IMPLANT
KIT ENDO PROCEDURE OLY (KITS) ×3 IMPLANT
MARKER SPOT ENDO TATTOO 5ML (MISCELLANEOUS) IMPLANT
PAD GROUND ADULT SPLIT (MISCELLANEOUS) IMPLANT
PROBE APC STR FIRE (PROBE) IMPLANT
RETRIEVER NET ROTH 2.5X230 LF (MISCELLANEOUS) IMPLANT
SNARE SHORT THROW 13M SML OVAL (MISCELLANEOUS) IMPLANT
SNARE SHORT THROW 30M LRG OVAL (MISCELLANEOUS) IMPLANT
SNARE SNG USE RND 15MM (INSTRUMENTS) IMPLANT
SPOT EX ENDOSCOPIC TATTOO (MISCELLANEOUS)
TRAP ETRAP POLY (MISCELLANEOUS) IMPLANT
VARIJECT INJECTOR VIN23 (MISCELLANEOUS)
WATER STERILE IRR 250ML POUR (IV SOLUTION) ×3 IMPLANT

## 2016-09-04 NOTE — Op Note (Signed)
[facility] Gastroenterology Patient Name: Jeanette [name] Procedure Date: 09/04/2016 10:26 AM MRN: [id number] Account #: [id number] Date of Birth: [birth year] Admit Type: Outpatient Age: 66 Room: [facility] OR ROOM 01 Gender: Female Note Status: Finalized Procedure:            Colonoscopy Indications:          Screening for colorectal malignant neoplasm Providers:            Lucilla Lame MD, MD Referring MD:         Satira Anis. Plonk, MD (Referring MD) Medicines:            Propofol per Anesthesia Complications:        No immediate complications. Procedure:            Pre-Anesthesia Assessment:                       - Prior to the procedure, a History and Physical was                        performed, and patient medications and allergies were                        reviewed. The patient's tolerance of previous                        anesthesia was also reviewed. The risks and benefits of                        the procedure and the sedation options and risks were                        discussed with the patient. All questions were                        answered, and informed consent was obtained. Prior                        Anticoagulants: The patient has taken no previous                        anticoagulant or antiplatelet agents. ASA Grade                        Assessment: II - A patient with mild systemic disease.                        After reviewing the risks and benefits, the patient was                        deemed in satisfactory condition to undergo the                        procedure.                       After obtaining informed consent, the colonoscope was                        passed under direct vision. Throughout the procedure,  the patient's blood pressure, pulse, and oxygen                        saturations were monitored continuously. The [place] (S#: [id number]) was introduced  through                        the anus and advanced to the the cecum, identified by                        appendiceal orifice and ileocecal valve. The                        colonoscopy was performed without difficulty. The                        patient tolerated the procedure well. The quality of                        the bowel preparation was excellent. Findings:      The perianal and digital rectal examinations were normal.      An 8 mm polypoid lesion was found appendiceal orifice. The lesion was       pedunculated. No bleeding was present. Biopsies were taken with a cold       forceps for histology. To prevent bleeding post-intervention, one       hemostatic clip was successfully placed (MR conditional). There was no       bleeding at the end of the procedure.      A 5 mm polyp was found in the descending colon. The polyp was sessile.       The polyp was removed with a cold biopsy forceps. Resection and       retrieval were complete.      A few small-mouthed diverticula were found in the sigmoid colon.      Non-bleeding internal hemorrhoids were found during retroflexion. The       hemorrhoids were Grade II (internal hemorrhoids that prolapse but reduce       spontaneously). Impression:           - Polypoid lesion at the appendiceal orifice. Biopsied.                        Clip (MR conditional) was placed.                       - One 5 mm polyp in the descending colon, removed with                        a cold biopsy forceps. Resected and retrieved.                       - Diverticulosis in the sigmoid colon.                       - Non-bleeding internal hemorrhoids. Recommendation:       - Discharge patient to home.                       -  Resume previous diet.                       - Continue present medications.                       - Await pathology results.                       - If cecal lesion adenoma then will need to be removed. Procedure Code(s):    ---  Professional ---                       [phone], Colonoscopy, flexible; with biopsy, single or                        multiple Diagnosis Code(s):    --- Professional ---                       Z12.11, Encounter for screening for malignant neoplasm                        of colon                       D49.0, Neoplasm of unspecified behavior of digestive                        system                       D12.4, Benign neoplasm of descending colon CPT copyright 2016 American Medical Association. All rights reserved. The codes documented in this report are preliminary and upon coder review may  be revised to meet current compliance requirements. Lucilla Lame MD, MD 09/04/2016 10:54:49 AM This report has been signed electronically. Number of Addenda: 0 Note Initiated On: 09/04/2016 10:26 AM Scope Withdrawal Time: 0 hours 8 minutes 53 seconds  Total Procedure Duration: 0 hours 12 minutes 35 seconds       [facility]

## 2016-09-04 NOTE — H&P (Signed)
   Jeanette Lame, MD [address] [place], [place] [address] Phone: [phone] Fax : [phone]  Primary Care Physician:  Adline Potter, MD Primary Gastroenterologist:  Dr. Allen Norris  Pre-Procedure History & Physical: HPI:  Jeanette [name] is a 66 y.o. female is here for a screening colonoscopy.   Past Medical History:  Diagnosis Date  . Family history of adverse reaction to anesthesia    Sister - slow to wake  . Heart murmur    followed by PCP  . Hypertension    before gastric bypass  . Knee pain   . Vertigo    no episodes in over 2 yrs  . Wears dentures    full upper and lower    Past Surgical History:  Procedure Laterality Date  . ABDOMINAL HYSTERECTOMY    . APPENDECTOMY    . CHOLECYSTECTOMY    . GASTRIC BYPASS    . HERNIA REPAIR      Prior to Admission medications   Medication Sig Start Date End Date Taking? Authorizing Provider  Cholecalciferol (VITAMIN D3) 1000 units CAPS Take 1 capsule (1,000 Units total) by mouth daily. 07/25/16   Plonk, Gwyndolyn Saxon, MD  hydrocortisone (ANUSOL-HC) 2.5 % rectal cream Place 1 application rectally 2 (two) times daily as needed for hemorrhoids or anal itching. 07/24/16   Plonk, Gwyndolyn Saxon, MD  Loperamide HCl (ANTI-DIARRHEAL PO) Take by mouth.    [provider]  loratadine (ALLERGY) 10 MG tablet Take 10 mg by mouth daily.    [provider]  Multiple Vitamins-Minerals (CENTRUM SILVER PO) Take by mouth.    [provider]  sertraline (ZOLOFT) 100 MG tablet Take 1 tablet (100 mg total) by mouth daily. 08/25/16   Adline Potter, MD    Allergies as of 08/10/2016  . (No Known Allergies)    Family History  Problem Relation Age of Onset  . Cancer Father        stomach    Social History   Social History  . Marital status: Married    Spouse name: N/A  . Number of children: N/A  . Years of education: N/A   Occupational History  . Not on file.   Social History Main Topics  . Smoking status:  Never Smoker  . Smokeless tobacco: Never Used  . Alcohol use No  . Drug use: No  . Sexual activity: Not on file   Other Topics Concern  . Not on file   Social History Narrative  . No narrative on file    Review of Systems: See HPI, otherwise negative ROS  Physical Exam: BP (!) 189/90   Pulse 96   Temp (!) 97.5 F (36.4 C) (Temporal)   Resp 16   Ht 5\' 6"  (1.676 m)   Wt 209 lb (94.8 kg)   SpO2 96%   BMI 33.73 kg/m  General:   Alert,  pleasant and cooperative in NAD Head:  Normocephalic and atraumatic. Neck:  Supple; no masses or thyromegaly. Lungs:  Clear throughout to auscultation.    Heart:  Regular rate and rhythm. Abdomen:  Soft, nontender and nondistended. Normal bowel sounds, without guarding, and without rebound.   Neurologic:  Alert and  oriented x4;  grossly normal neurologically.  Impression/Plan: [name] is now here to undergo a screening colonoscopy.  Risks, benefits, and alternatives regarding colonoscopy have been reviewed with the patient.  Questions have been answered.  All parties agreeable.

## 2016-09-04 NOTE — Anesthesia Postprocedure Evaluation (Signed)
Anesthesia Post Note  Patient: Jeanette [name]  Procedure(s) Performed: Procedure(s) (LRB): COLONOSCOPY WITH PROPOFOL (N/A) POLYPECTOMY  Patient location during evaluation: PACU Anesthesia Type: General Level of consciousness: awake and alert, oriented and patient cooperative Pain management: pain level controlled Vital Signs Assessment: post-procedure vital signs reviewed and stable Respiratory status: spontaneous breathing, nonlabored ventilation and respiratory function stable Cardiovascular status: blood pressure returned to baseline and stable Postop Assessment: adequate PO intake Anesthetic complications: no    Darrin Nipper

## 2016-09-04 NOTE — Anesthesia Preprocedure Evaluation (Signed)
Anesthesia Evaluation  Patient identified by MRN, date of birth, ID band Patient awake    Reviewed: Allergy & Precautions, NPO status , Patient's Chart, lab work & pertinent test results  History of Anesthesia Complications Negative for: history of anesthetic complications  Airway Mallampati: II  TM Distance: >3 FB Neck ROM: Full    Dental no notable dental hx.    Pulmonary neg pulmonary ROS,    Pulmonary exam normal breath sounds clear to auscultation       Cardiovascular Exercise Tolerance: Good hypertension, + Valvular Problems/Murmurs (murmur)  Rhythm:Regular Rate:Normal     Neuro/Psych PSYCHIATRIC DISORDERS Depression negative neurological ROS     GI/Hepatic S/p gastric bypass   Endo/Other  negative endocrine ROS  Renal/GU negative Renal ROS     Musculoskeletal   Abdominal   Peds  Hematology negative hematology ROS (+)   Anesthesia Other Findings   Reproductive/Obstetrics                             Anesthesia Physical Anesthesia Plan  ASA: II  Anesthesia Plan: General   Post-op Pain Management:    Induction: Intravenous  PONV Risk Score and Plan: 2 and Ondansetron and Propofol infusion  Airway Management Planned: Natural Airway  Additional Equipment:   Intra-op Plan:   Post-operative Plan:   Informed Consent: I have reviewed the patients History and Physical, chart, labs and discussed the procedure including the risks, benefits and alternatives for the proposed anesthesia with the patient or authorized representative who has indicated his/her understanding and acceptance.     Plan Discussed with: CRNA  Anesthesia Plan Comments:         Anesthesia Quick Evaluation

## 2016-09-04 NOTE — Transfer of Care (Signed)
Immediate Anesthesia Transfer of Care Note  Patient: Jeanette [name]  Procedure(s) Performed: Procedure(s) with comments: COLONOSCOPY WITH PROPOFOL (N/A) - requests early POLYPECTOMY  Patient Location: PACU  Anesthesia Type: General  Level of Consciousness: awake, alert  and patient cooperative  Airway and Oxygen Therapy: Patient Spontanous Breathing and Patient connected to supplemental oxygen  Post-op Assessment: Post-op Vital signs reviewed, Patient's Cardiovascular Status Stable, Respiratory Function Stable, Patent Airway and No signs of Nausea or vomiting  Post-op Vital Signs: Reviewed and stable  Complications: No apparent anesthesia complications

## 2016-09-04 NOTE — Anesthesia Procedure Notes (Signed)
Procedure Name: MAC Date/Time: 09/04/2016 10:33 AM Performed by: Janna Arch Pre-anesthesia Checklist: Patient identified, Emergency Drugs available, Suction available and Patient being monitored Patient Re-evaluated:Patient Re-evaluated prior to induction Oxygen Delivery Method: Nasal cannula

## 2016-09-05 ENCOUNTER — Encounter: Payer: Self-pay | Admitting: Gastroenterology

## 2016-09-06 ENCOUNTER — Encounter: Payer: Self-pay | Admitting: Gastroenterology

## 2016-11-17 DIAGNOSIS — M5430 Sciatica, unspecified side: Secondary | ICD-10-CM | POA: Diagnosis not present

## 2016-11-17 DIAGNOSIS — R51 Headache: Secondary | ICD-10-CM | POA: Diagnosis not present

## 2016-11-17 DIAGNOSIS — M9903 Segmental and somatic dysfunction of lumbar region: Secondary | ICD-10-CM | POA: Diagnosis not present

## 2016-11-17 DIAGNOSIS — M9901 Segmental and somatic dysfunction of cervical region: Secondary | ICD-10-CM | POA: Diagnosis not present

## 2016-11-17 DIAGNOSIS — M9902 Segmental and somatic dysfunction of thoracic region: Secondary | ICD-10-CM | POA: Diagnosis not present

## 2016-11-17 DIAGNOSIS — M9905 Segmental and somatic dysfunction of pelvic region: Secondary | ICD-10-CM | POA: Diagnosis not present

## 2016-11-17 DIAGNOSIS — M791 Myalgia, unspecified site: Secondary | ICD-10-CM | POA: Diagnosis not present

## 2016-11-17 DIAGNOSIS — M6240 Contracture of muscle, unspecified site: Secondary | ICD-10-CM | POA: Diagnosis not present

## 2016-11-20 DIAGNOSIS — R51 Headache: Secondary | ICD-10-CM | POA: Diagnosis not present

## 2016-11-20 DIAGNOSIS — M6240 Contracture of muscle, unspecified site: Secondary | ICD-10-CM | POA: Diagnosis not present

## 2016-11-20 DIAGNOSIS — M9903 Segmental and somatic dysfunction of lumbar region: Secondary | ICD-10-CM | POA: Diagnosis not present

## 2016-11-20 DIAGNOSIS — M9905 Segmental and somatic dysfunction of pelvic region: Secondary | ICD-10-CM | POA: Diagnosis not present

## 2016-11-20 DIAGNOSIS — M5430 Sciatica, unspecified side: Secondary | ICD-10-CM | POA: Diagnosis not present

## 2016-11-20 DIAGNOSIS — M9901 Segmental and somatic dysfunction of cervical region: Secondary | ICD-10-CM | POA: Diagnosis not present

## 2016-11-20 DIAGNOSIS — M9902 Segmental and somatic dysfunction of thoracic region: Secondary | ICD-10-CM | POA: Diagnosis not present

## 2016-11-20 DIAGNOSIS — M791 Myalgia, unspecified site: Secondary | ICD-10-CM | POA: Diagnosis not present

## 2016-11-24 DIAGNOSIS — M791 Myalgia, unspecified site: Secondary | ICD-10-CM | POA: Diagnosis not present

## 2016-11-24 DIAGNOSIS — M9905 Segmental and somatic dysfunction of pelvic region: Secondary | ICD-10-CM | POA: Diagnosis not present

## 2016-11-24 DIAGNOSIS — M6240 Contracture of muscle, unspecified site: Secondary | ICD-10-CM | POA: Diagnosis not present

## 2016-11-24 DIAGNOSIS — R51 Headache: Secondary | ICD-10-CM | POA: Diagnosis not present

## 2016-11-24 DIAGNOSIS — M9901 Segmental and somatic dysfunction of cervical region: Secondary | ICD-10-CM | POA: Diagnosis not present

## 2016-11-24 DIAGNOSIS — M5430 Sciatica, unspecified side: Secondary | ICD-10-CM | POA: Diagnosis not present

## 2016-11-24 DIAGNOSIS — M9903 Segmental and somatic dysfunction of lumbar region: Secondary | ICD-10-CM | POA: Diagnosis not present

## 2016-11-24 DIAGNOSIS — M9902 Segmental and somatic dysfunction of thoracic region: Secondary | ICD-10-CM | POA: Diagnosis not present

## 2016-12-01 DIAGNOSIS — M9901 Segmental and somatic dysfunction of cervical region: Secondary | ICD-10-CM | POA: Diagnosis not present

## 2016-12-01 DIAGNOSIS — M9902 Segmental and somatic dysfunction of thoracic region: Secondary | ICD-10-CM | POA: Diagnosis not present

## 2016-12-01 DIAGNOSIS — M9905 Segmental and somatic dysfunction of pelvic region: Secondary | ICD-10-CM | POA: Diagnosis not present

## 2016-12-01 DIAGNOSIS — M5430 Sciatica, unspecified side: Secondary | ICD-10-CM | POA: Diagnosis not present

## 2016-12-01 DIAGNOSIS — R51 Headache: Secondary | ICD-10-CM | POA: Diagnosis not present

## 2016-12-01 DIAGNOSIS — M9903 Segmental and somatic dysfunction of lumbar region: Secondary | ICD-10-CM | POA: Diagnosis not present

## 2016-12-01 DIAGNOSIS — M791 Myalgia, unspecified site: Secondary | ICD-10-CM | POA: Diagnosis not present

## 2016-12-01 DIAGNOSIS — M6240 Contracture of muscle, unspecified site: Secondary | ICD-10-CM | POA: Diagnosis not present

## 2017-01-13 ENCOUNTER — Ambulatory Visit
Admission: EM | Admit: 2017-01-13 | Discharge: 2017-01-13 | Disposition: A | Discharge status: Home / Self Care | Payer: PPO | Attending: Family Medicine | Admitting: Family Medicine

## 2017-01-13 ENCOUNTER — Other Ambulatory Visit: Payer: Self-pay

## 2017-01-13 DIAGNOSIS — J0100 Acute maxillary sinusitis, unspecified: Secondary | ICD-10-CM | POA: Diagnosis not present

## 2017-01-13 DIAGNOSIS — R05 Cough: Secondary | ICD-10-CM | POA: Diagnosis not present

## 2017-01-13 DIAGNOSIS — J40 Bronchitis, not specified as acute or chronic: Secondary | ICD-10-CM | POA: Diagnosis not present

## 2017-01-13 DIAGNOSIS — R059 Cough, unspecified: Secondary | ICD-10-CM

## 2017-01-13 MED ORDER — HYDROCOD POLST-CPM POLST ER 10-8 MG/5ML PO SUER: 5.0000 mL | Freq: Two times a day (BID) | ORAL | 0 refills | Status: DC | PRN

## 2017-01-13 MED ORDER — AZITHROMYCIN 250 MG PO TABS: ORAL_TABLET | ORAL | 0 refills | Status: DC

## 2017-01-13 NOTE — ED Provider Notes (Signed)
[facility]    CSN: [id number] Arrival date & time: 01/13/17  1322     History   Chief Complaint Chief Complaint  Patient presents with  . Cough    HPI Jeanette [name] is a 66 y.o. female.   The history is provided by the patient.  Cough  Associated symptoms: wheezing   URI  Presenting symptoms: congestion, cough and facial pain   Severity:  Moderate Onset quality:  Sudden Duration:  10 days Timing:  Constant Progression:  Worsening Chronicity:  New Relieved by:  Nothing Ineffective treatments:  OTC medications Associated symptoms: sinus pain and wheezing   Risk factors: sick contacts   Risk factors: not elderly, no chronic cardiac disease, no chronic kidney disease, no chronic respiratory disease, no diabetes mellitus, no immunosuppression, no recent illness and no recent travel     Past Medical History:  Diagnosis Date  . Family history of adverse reaction to anesthesia    Sister - slow to wake  . Heart murmur    followed by PCP  . Hypertension    before gastric bypass  . Knee pain   . Vertigo    no episodes in over 2 yrs  . Wears dentures    full upper and lower    Patient Active Problem List   Diagnosis Date Noted  . Special screening for malignant neoplasms, colon   . Benign neoplasm of descending colon   . Vitamin D deficiency 07/25/2016  . Depression 07/24/2016  . History of gastric bypass 07/24/2016  . Gait instability 07/24/2016  . Diarrhea 07/24/2016  . Urticaria 07/24/2016  . External hemorrhoids 07/24/2016  . Obesity (BMI 30-39.9) 07/24/2016    Past Surgical History:  Procedure Laterality Date  . ABDOMINAL HYSTERECTOMY    . APPENDECTOMY    . CHOLECYSTECTOMY    . COLONOSCOPY WITH PROPOFOL N/A 09/04/2016   Procedure: COLONOSCOPY WITH PROPOFOL;  Surgeon: Lucilla Lame, MD;  Location: [place];  Service: Gastroenterology;  Laterality: N/A;  requests early  . GASTRIC BYPASS    . HERNIA REPAIR    . POLYPECTOMY   09/04/2016   Procedure: POLYPECTOMY;  Surgeon: Lucilla Lame, MD;  Location: [place];  Service: Gastroenterology;;    OB History    No data available       Home Medications    Prior to Admission medications   Medication Sig Start Date End Date Taking? Authorizing Provider  Cholecalciferol (VITAMIN D3) 1000 units CAPS Take 1 capsule (1,000 Units total) by mouth daily. 07/25/16  Yes Plonk, Gwyndolyn Saxon, MD  hydrocortisone (ANUSOL-HC) 2.5 % rectal cream Place 1 application rectally 2 (two) times daily as needed for hemorrhoids or anal itching. 07/24/16  Yes Plonk, Gwyndolyn Saxon, MD  Loperamide HCl (ANTI-DIARRHEAL PO) Take by mouth.   Yes [provider]  loratadine (ALLERGY) 10 MG tablet Take 10 mg by mouth daily.   Yes [provider]  Multiple Vitamins-Minerals (CENTRUM SILVER PO) Take by mouth.   Yes [provider]  sertraline (ZOLOFT) 100 MG tablet Take 1 tablet (100 mg total) by mouth daily. 08/25/16  Yes Plonk, Gwyndolyn Saxon, MD  azithromycin (ZITHROMAX Z-PAK) 250 MG tablet 2 tabs po once day 1, then 1 tab po qd for next 4 days 01/13/17   Norval Gable, MD  chlorpheniramine-HYDROcodone [facility] PENNKINETIC ER) 10-8 MG/5ML SUER Take 5 mLs by mouth every 12 (twelve) hours as needed. 01/13/17   Norval Gable, MD    Family History Family History  Problem Relation Age  of Onset  . Cancer Father        stomach    Social History Social History   Tobacco Use  . Smoking status: Never Smoker  . Smokeless tobacco: Never Used  Substance Use Topics  . Alcohol use: No  . Drug use: No     Allergies   Patient has no known allergies.   Review of Systems Review of Systems  HENT: Positive for congestion and sinus pain.   Respiratory: Positive for cough and wheezing.      Physical Exam Triage Vital Signs ED Triage Vitals  Enc Vitals Group     BP 01/13/17 1429 (!) 162/84     Pulse Rate 01/13/17 1429 79     Resp --      Temp 01/13/17 1429 98.4 F (36.9  C)     Temp Source 01/13/17 1429 Oral     SpO2 01/13/17 1429 96 %     Weight 01/13/17 1430 218 lb (98.9 kg)     Height 01/13/17 1430 5\' 7"  (1.702 m)     Head Circumference --      Peak Flow --      Pain Score 01/13/17 1430 3     Pain Loc --      Pain Edu? --      Excl. in [place]? --    No data found.  Updated Vital Signs BP (!) 162/84 (BP Location: Left Arm)   Pulse 79   Temp 98.4 F (36.9 C) (Oral)   Ht 5\' 7"  (1.702 m)   Wt 218 lb (98.9 kg)   SpO2 96%   BMI 34.14 kg/m   Visual Acuity Right Eye Distance:   Left Eye Distance:   Bilateral Distance:    Right Eye Near:   Left Eye Near:    Bilateral Near:     Physical Exam  Constitutional: She appears well-developed and well-nourished. No distress.  HENT:  Head: Normocephalic and atraumatic.  Right Ear: Tympanic membrane, external ear and ear canal normal.  Left Ear: Tympanic membrane, external ear and ear canal normal.  Nose: Mucosal edema and rhinorrhea present. No nose lacerations, sinus tenderness, nasal deformity, septal deviation or nasal septal hematoma. No epistaxis.  No foreign bodies. Right sinus exhibits maxillary sinus tenderness and frontal sinus tenderness. Left sinus exhibits maxillary sinus tenderness and frontal sinus tenderness.  Mouth/Throat: Uvula is midline, oropharynx is clear and moist and mucous membranes are normal. No oropharyngeal exudate.  Eyes: Conjunctivae and EOM are normal. Pupils are equal, round, and reactive to light. Right eye exhibits no discharge. Left eye exhibits no discharge. No scleral icterus.  Neck: Normal range of motion. Neck supple. No thyromegaly present.  Cardiovascular: Normal rate, regular rhythm and normal heart sounds.  Pulmonary/Chest: Effort normal. No stridor. No respiratory distress. She has no wheezes. She has no rales.  Diffuse rhonchi  Lymphadenopathy:    She has no cervical adenopathy.  Skin: She is not diaphoretic.  Nursing note and vitals reviewed.    UC  Treatments / Results  Labs (all labs ordered are listed, but only abnormal results are displayed) Labs Reviewed - No data to display  EKG  EKG Interpretation None       Radiology No results found.  Procedures Procedures (including critical care time)  Medications Ordered in UC Medications - No data to display   Initial Impression / Assessment and Plan / UC Course  I have reviewed the triage vital signs and the nursing notes.  Pertinent  labs & imaging results that were available during my care of the patient were reviewed by me and considered in my medical decision making (see chart for details).       Final Clinical Impressions(s) / UC Diagnoses   Final diagnoses:  Cough  Bronchitis  Acute maxillary sinusitis, recurrence not specified    ED Discharge Orders        Ordered    chlorpheniramine-HYDROcodone (TUSSIONEX PENNKINETIC ER) 10-8 MG/5ML SUER  Every 12 hours PRN     01/13/17 1526    azithromycin (ZITHROMAX Z-PAK) 250 MG tablet     01/13/17 1526     1. diagnosis reviewed with patient 2. rx as per orders above; reviewed possible side effects, interactions, risks and benefits  3. Recommend supportive treatment with rest, fluids 4. Follow-up prn if symptoms worsen or don't improve  Controlled Substance Prescriptions [place] Controlled Substance Registry consulted? Not Applicable   Norval Gable, MD 01/13/17 [phone]

## 2017-01-13 NOTE — ED Triage Notes (Signed)
Patient c/o cough and congestion x 10 days, sore throat x 1 day. Patient has tried mucinex with slight relief.

## 2017-02-26 ENCOUNTER — Ambulatory Visit: Payer: PPO | Admitting: Family Medicine

## 2017-03-08 ENCOUNTER — Emergency Department
Admission: EM | Admit: 2017-03-08 | Discharge: 2017-03-09 | Disposition: A | Discharge status: Home / Self Care | Payer: Medicare HMO | Attending: Emergency Medicine | Admitting: Emergency Medicine

## 2017-03-08 DIAGNOSIS — R110 Nausea: Secondary | ICD-10-CM | POA: Insufficient documentation

## 2017-03-08 DIAGNOSIS — R040 Epistaxis: Secondary | ICD-10-CM | POA: Diagnosis not present

## 2017-03-08 DIAGNOSIS — I10 Essential (primary) hypertension: Secondary | ICD-10-CM | POA: Insufficient documentation

## 2017-03-08 DIAGNOSIS — Z79899 Other long term (current) drug therapy: Secondary | ICD-10-CM | POA: Insufficient documentation

## 2017-03-08 DIAGNOSIS — Z7902 Long term (current) use of antithrombotics/antiplatelets: Secondary | ICD-10-CM | POA: Insufficient documentation

## 2017-03-08 MED ORDER — OXYMETAZOLINE HCL 0.05 % NA SOLN
NASAL | Status: AC
  Administered 2017-03-08: 1 via NASAL
  Filled 2017-03-08: qty 15

## 2017-03-08 MED ORDER — OXYMETAZOLINE HCL 0.05 % NA SOLN
1.0000 | Freq: Once | NASAL | Status: AC
  Administered 2017-03-08: 1 via NASAL

## 2017-03-08 NOTE — ED Triage Notes (Signed)
Patient reports intermittent nosebleeds X 1 week. Current episode lasting for 1 hour.

## 2017-03-08 NOTE — ED Notes (Signed)
ED Provider at bedside. 

## 2017-03-08 NOTE — ED Provider Notes (Signed)
[facility] Emergency Department Provider Note  ____________________________________________   First MD Initiated Contact with Patient 03/08/17 2329     (approximate)  I have reviewed the triage vital signs and the nursing notes.   HISTORY  Chief Complaint Epistaxis   HPI Jeanette [name] is a 67 y.o. female comes to the emergency department with 1 hour of nontraumatic epistaxis.  She takes no aspirin, Plavix, or other blood thinning medication.  She is intermittently held pressure and released it over the past hour and has noted the bleeding is not stopped.  She is tilted her head back and it is made her nauseated she feels like the blood is going down her throat.  She does have a history of seasonal al nothing seemed to make the symptoms worse.  It is somewhat improved with pressure.  Her symptoms are moderate severity.  She has mild aching sharp discomfort in her nose.  Nonradiating.  Past Medical History:  Diagnosis Date  . Family history of adverse reaction to anesthesia    Sister - slow to wake  . Heart murmur    followed by PCP  . Hypertension    before gastric bypass  . Knee pain   . Vertigo    no episodes in over 2 yrs  . Wears dentures    full upper and lower    Patient Active Problem List   Diagnosis Date Noted  . Special screening for malignant neoplasms, colon   . Benign neoplasm of descending colon   . Vitamin D deficiency 07/25/2016  . Depression 07/24/2016  . History of gastric bypass 07/24/2016  . Gait instability 07/24/2016  . Diarrhea 07/24/2016  . Urticaria 07/24/2016  . External hemorrhoids 07/24/2016  . Obesity (BMI 30-39.9) 07/24/2016    Past Surgical History:  Procedure Laterality Date  . ABDOMINAL HYSTERECTOMY    . APPENDECTOMY    . CHOLECYSTECTOMY    . COLONOSCOPY WITH PROPOFOL N/A 09/04/2016   Procedure: COLONOSCOPY WITH PROPOFOL;  Surgeon: Lucilla Lame, MD;  Location: [place];  Service:  Gastroenterology;  Laterality: N/A;  requests early  . GASTRIC BYPASS    . HERNIA REPAIR    . POLYPECTOMY  09/04/2016   Procedure: POLYPECTOMY;  Surgeon: Lucilla Lame, MD;  Location: [place];  Service: Gastroenterology;;    Prior to Admission medications   Medication Sig Start Date End Date Taking? Authorizing Provider  azithromycin (ZITHROMAX Z-PAK) 250 MG tablet 2 tabs po once day 1, then 1 tab po qd for next 4 days 01/13/17   Norval Gable, MD  chlorpheniramine-HYDROcodone [facility] PENNKINETIC ER) 10-8 MG/5ML SUER Take 5 mLs by mouth every 12 (twelve) hours as needed. 01/13/17   Norval Gable, MD  Cholecalciferol (VITAMIN D3) 1000 units CAPS Take 1 capsule (1,000 Units total) by mouth daily. 07/25/16   Plonk, Gwyndolyn Saxon, MD  hydrocortisone (ANUSOL-HC) 2.5 % rectal cream Place 1 application rectally 2 (two) times daily as needed for hemorrhoids or anal itching. 07/24/16   Plonk, Gwyndolyn Saxon, MD  Loperamide HCl (ANTI-DIARRHEAL PO) Take by mouth.    [provider]  loratadine (ALLERGY) 10 MG tablet Take 10 mg by mouth daily.    [provider]  Multiple Vitamins-Minerals (CENTRUM SILVER PO) Take by mouth.    [provider]  sertraline (ZOLOFT) 100 MG tablet Take 1 tablet (100 mg total) by mouth daily. 08/25/16   Adline Potter, MD    Allergies Patient has no known allergies.  Family History  Problem  Relation Age of Onset  . Cancer Father        stomach    Social History Social History   Tobacco Use  . Smoking status: Never Smoker  . Smokeless tobacco: Never Used  Substance Use Topics  . Alcohol use: No  . Drug use: No    Review of Systems Constitutional: No fever/chills Eyes: No visual changes. ENT: No sore throat. Cardiovascular: Denies chest pain. Respiratory: Denies shortness of breath. Gastrointestinal: No abdominal pain.  For nausea, no vomiting.  No diarrhea.  No constipation. Genitourinary: Negative for dysuria. Musculoskeletal:  Negative for back pain. Skin: Negative for rash. Neurological: Negative for headaches, focal weakness or numbness.   ____________________________________________   PHYSICAL EXAM:  VITAL SIGNS: ED Triage Vitals  Enc Vitals Group     BP 03/08/17 2246 (!) 173/117     Pulse Rate 03/08/17 2246 98     Resp 03/08/17 2246 18     Temp 03/08/17 2246 98.1 F (36.7 C)     Temp Source 03/08/17 2246 Oral     SpO2 03/08/17 2246 96 %     Weight 03/08/17 2247 220 lb (99.8 kg)     Height --      Head Circumference --      Peak Flow --      Pain Score --      Pain Loc --      Pain Edu? --      Excl. in [place]? --     Constitutional: Alert and oriented x4 somewhat anxious appearing nontoxic no diaphoresis speaks full clear sentences Eyes: PERRL EOMI. Head: Atraumatic. Nose: Right knee are normal evidence of recent anterior bleeding on the left Mouth/Throat: No trismus no posterior bleeding Neck: No stridor.   Cardiovascular: Normal rate, regular rhythm. Grossly normal heart sounds.  Good peripheral circulation. Respiratory: Normal respiratory effort.  No retractions. Lungs CTAB and moving good air Gastrointestinal: Soft nontender Musculoskeletal: No lower extremity edema   Neurologic:  Normal speech and language. No gross focal neurologic deficits are appreciated. Skin:  Skin is warm, dry and intact. No rash noted. Psychiatric: Mood and affect are normal. Speech and behavior are normal.    ____________________________________________   DIFFERENTIAL includes but not limited to  Anterior epistaxis, posterior epistaxis, trauma, nasal fracture ____________________________________________   LABS (all labs ordered are listed, but only abnormal results are displayed)  Labs Reviewed - No data to  display   __________________________________________  EKG   ____________________________________________  RADIOLOGY   ____________________________________________   PROCEDURES  Procedure(s) performed: no  Procedures  Critical Care performed: no  Observation: no ____________________________________________   INITIAL IMPRESSION / ASSESSMENT AND PLAN / ED COURSE  Pertinent labs & imaging results that were available during my care of the patient were reviewed by me and considered in my medical decision making (see chart for details).  The patient arrives with a clamp on her nose.  No evidence of posterior bleeding.  I will reevaluate in 20 minutes.    ----------------------------------------- 12:09 AM on 03/09/2017 -----------------------------------------  After 20 minutes of constant pressure I removed the clamp and the patient had evidence of recent anterior left-sided bleeding with no recurrent bleed.  Have explained to the patient how to properly stop a bleed in the future and have encouraged her to continue her allergy medication.  Discharged home in improved condition she verbalizes understanding and agree with the plan. ____________________________________________   FINAL CLINICAL IMPRESSION(S) / ED DIAGNOSES  Final diagnoses:  Left-sided epistaxis  Anterior epistaxis      NEW MEDICATIONS STARTED DURING THIS VISIT:  Discharge Medication List as of 03/09/2017 12:08 AM       Note:  This document was prepared using Dragon voice recognition software and may include unintentional dictation errors.     Darel Hong, MD 03/10/17 [phone]

## 2017-03-09 MED ORDER — BACITRACIN ZINC 500 UNIT/GM EX OINT
TOPICAL_OINTMENT | Freq: Once | CUTANEOUS | Status: AC
  Administered 2017-03-09: via TOPICAL
  Filled 2017-03-09: qty 0.9

## 2017-03-09 NOTE — ED Notes (Signed)

## 2017-03-09 NOTE — Discharge Instructions (Signed)
Please make sure you continue taking your over-the-counter allergy medicine every single day and use Vaseline or Neosporin in your nose to help make sure it stays moist and to prevent bleeding in the future.  Return to the emergency department for any concerns such as if the bleeding does not resolve after 15 minutes of constant pressure.  It was a pleasure to take care of you today, and thank you for coming to our emergency department.  If you have any questions or concerns before leaving please ask the nurse to grab me and I'm more than happy to go through your aftercare instructions again.  If you were prescribed any opioid pain medication today such as Norco, Vicodin, Percocet, morphine, hydrocodone, or oxycodone please make sure you do not drive when you are taking this medication as it can alter your ability to drive safely.  If you have any concerns once you are home that you are not improving or are in fact getting worse before you can make it to your follow-up appointment, please do not hesitate to call 911 and come back for further evaluation.  Darel Hong, MD

## 2017-03-23 DIAGNOSIS — R51 Headache: Secondary | ICD-10-CM | POA: Diagnosis not present

## 2017-03-23 DIAGNOSIS — M6240 Contracture of muscle, unspecified site: Secondary | ICD-10-CM | POA: Diagnosis not present

## 2017-03-23 DIAGNOSIS — M9901 Segmental and somatic dysfunction of cervical region: Secondary | ICD-10-CM | POA: Diagnosis not present

## 2017-03-23 DIAGNOSIS — M5430 Sciatica, unspecified side: Secondary | ICD-10-CM | POA: Diagnosis not present

## 2017-03-23 DIAGNOSIS — M9903 Segmental and somatic dysfunction of lumbar region: Secondary | ICD-10-CM | POA: Diagnosis not present

## 2017-03-23 DIAGNOSIS — M9905 Segmental and somatic dysfunction of pelvic region: Secondary | ICD-10-CM | POA: Diagnosis not present

## 2017-03-23 DIAGNOSIS — M791 Myalgia, unspecified site: Secondary | ICD-10-CM | POA: Diagnosis not present

## 2017-03-23 DIAGNOSIS — M9902 Segmental and somatic dysfunction of thoracic region: Secondary | ICD-10-CM | POA: Diagnosis not present

## 2017-03-28 DIAGNOSIS — R51 Headache: Secondary | ICD-10-CM | POA: Diagnosis not present

## 2017-03-28 DIAGNOSIS — M791 Myalgia, unspecified site: Secondary | ICD-10-CM | POA: Diagnosis not present

## 2017-03-28 DIAGNOSIS — M9905 Segmental and somatic dysfunction of pelvic region: Secondary | ICD-10-CM | POA: Diagnosis not present

## 2017-03-28 DIAGNOSIS — M5430 Sciatica, unspecified side: Secondary | ICD-10-CM | POA: Diagnosis not present

## 2017-03-28 DIAGNOSIS — M6240 Contracture of muscle, unspecified site: Secondary | ICD-10-CM | POA: Diagnosis not present

## 2017-03-28 DIAGNOSIS — M9901 Segmental and somatic dysfunction of cervical region: Secondary | ICD-10-CM | POA: Diagnosis not present

## 2017-03-28 DIAGNOSIS — M9902 Segmental and somatic dysfunction of thoracic region: Secondary | ICD-10-CM | POA: Diagnosis not present

## 2017-03-28 DIAGNOSIS — M9903 Segmental and somatic dysfunction of lumbar region: Secondary | ICD-10-CM | POA: Diagnosis not present

## 2017-03-30 DIAGNOSIS — M791 Myalgia, unspecified site: Secondary | ICD-10-CM | POA: Diagnosis not present

## 2017-03-30 DIAGNOSIS — M6240 Contracture of muscle, unspecified site: Secondary | ICD-10-CM | POA: Diagnosis not present

## 2017-03-30 DIAGNOSIS — M9901 Segmental and somatic dysfunction of cervical region: Secondary | ICD-10-CM | POA: Diagnosis not present

## 2017-03-30 DIAGNOSIS — M9905 Segmental and somatic dysfunction of pelvic region: Secondary | ICD-10-CM | POA: Diagnosis not present

## 2017-03-30 DIAGNOSIS — M9903 Segmental and somatic dysfunction of lumbar region: Secondary | ICD-10-CM | POA: Diagnosis not present

## 2017-03-30 DIAGNOSIS — R51 Headache: Secondary | ICD-10-CM | POA: Diagnosis not present

## 2017-03-30 DIAGNOSIS — M9902 Segmental and somatic dysfunction of thoracic region: Secondary | ICD-10-CM | POA: Diagnosis not present

## 2017-03-30 DIAGNOSIS — M5430 Sciatica, unspecified side: Secondary | ICD-10-CM | POA: Diagnosis not present

## 2017-04-05 ENCOUNTER — Encounter: Payer: Self-pay | Admitting: Podiatry

## 2017-04-05 ENCOUNTER — Ambulatory Visit (INDEPENDENT_AMBULATORY_CARE_PROVIDER_SITE_OTHER): Payer: Medicare HMO | Admitting: Podiatry

## 2017-04-05 VITALS — BP 159/92 | HR 71 | Temp 97.9°F | Resp 14

## 2017-04-05 DIAGNOSIS — L603 Nail dystrophy: Secondary | ICD-10-CM | POA: Diagnosis not present

## 2017-04-05 DIAGNOSIS — M2030 Hallux varus (acquired), unspecified foot: Secondary | ICD-10-CM

## 2017-04-05 DIAGNOSIS — M2041 Other hammer toe(s) (acquired), right foot: Secondary | ICD-10-CM | POA: Diagnosis not present

## 2017-04-05 DIAGNOSIS — M2042 Other hammer toe(s) (acquired), left foot: Secondary | ICD-10-CM | POA: Diagnosis not present

## 2017-04-05 NOTE — Progress Notes (Signed)
This patient presents the office concerned about the big toenails on both feet.  She's presents the office today with her husband.  She says that she has had her toenails falling off and regrowing when she walks and wears her shoes.  She says she was told to make an appointment for an evaluation of her nails since many people told her she had a fungus in her big toenails.  She does say that her right big toenail has been painful and has developed a brown scab-like lesion under the nail.  She presents the office today for an evaluation of her toenails both big toes.  General Appearance  Alert, conversant and in no acute stress.  Vascular  Dorsalis pedis pulses  are palpable  bilaterally.  Capillary return is within normal limits  bilaterally. Temperature is within normal limits  bilaterally.  Neurologic  Senn-Weinstein monofilament wire test within normal limits  bilaterally. Muscle power within normal limits bilaterally.  Nails nail dystrophy noted on the hallux left foot.  The nail plate on the left foot is absent.  Examination of the right great toenail reveals a nail plate that is only half grown to the middle of the nailbed with pus noted under the lateral aspect nail plate right hallux.  No evidence of any pus or drainage noted  Orthopedic  No limitations of motion of motion feet .  No crepitus or effusions noted.  Hallux malleus noted bilaterally with contracted. EHL. Hammer toes 2 through 5 bilaterally. Second toe right foot is deformed.    Skin  normotropic skin with no porokeratosis noted bilaterally.  No signs of infections or ulcers noted.     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   Return to the clinic when necessary   Gardiner Barefoot DPM

## 2017-04-06 DIAGNOSIS — M9901 Segmental and somatic dysfunction of cervical region: Secondary | ICD-10-CM | POA: Diagnosis not present

## 2017-04-06 DIAGNOSIS — M5430 Sciatica, unspecified side: Secondary | ICD-10-CM | POA: Diagnosis not present

## 2017-04-06 DIAGNOSIS — M791 Myalgia, unspecified site: Secondary | ICD-10-CM | POA: Diagnosis not present

## 2017-04-06 DIAGNOSIS — M9902 Segmental and somatic dysfunction of thoracic region: Secondary | ICD-10-CM | POA: Diagnosis not present

## 2017-04-06 DIAGNOSIS — M9905 Segmental and somatic dysfunction of pelvic region: Secondary | ICD-10-CM | POA: Diagnosis not present

## 2017-04-06 DIAGNOSIS — M6240 Contracture of muscle, unspecified site: Secondary | ICD-10-CM | POA: Diagnosis not present

## 2017-04-06 DIAGNOSIS — R51 Headache: Secondary | ICD-10-CM | POA: Diagnosis not present

## 2017-04-06 DIAGNOSIS — M9903 Segmental and somatic dysfunction of lumbar region: Secondary | ICD-10-CM | POA: Diagnosis not present

## 2017-06-20 ENCOUNTER — Other Ambulatory Visit
Admission: RE | Admit: 2017-06-20 | Discharge: 2017-06-20 | Disposition: A | Discharge status: Home / Self Care | Payer: Medicare HMO | Source: Ambulatory Visit | Attending: Internal Medicine | Admitting: Internal Medicine

## 2017-06-20 ENCOUNTER — Ambulatory Visit (INDEPENDENT_AMBULATORY_CARE_PROVIDER_SITE_OTHER): Payer: Medicare HMO | Admitting: Internal Medicine

## 2017-06-20 ENCOUNTER — Encounter: Payer: Self-pay | Admitting: Internal Medicine

## 2017-06-20 VITALS — BP 150/94 | HR 98 | Temp 97.6°F | Resp 16 | Ht 67.0 in | Wt 237.0 lb

## 2017-06-20 DIAGNOSIS — Z9884 Bariatric surgery status: Secondary | ICD-10-CM | POA: Diagnosis not present

## 2017-06-20 DIAGNOSIS — I10 Essential (primary) hypertension: Secondary | ICD-10-CM

## 2017-06-20 DIAGNOSIS — R197 Diarrhea, unspecified: Secondary | ICD-10-CM | POA: Diagnosis not present

## 2017-06-20 DIAGNOSIS — F331 Major depressive disorder, recurrent, moderate: Secondary | ICD-10-CM | POA: Diagnosis not present

## 2017-06-20 DIAGNOSIS — K909 Intestinal malabsorption, unspecified: Secondary | ICD-10-CM | POA: Diagnosis not present

## 2017-06-20 DIAGNOSIS — R69 Illness, unspecified: Secondary | ICD-10-CM | POA: Diagnosis not present

## 2017-06-20 LAB — CBC WITH DIFFERENTIAL/PLATELET
Basophils Absolute: 0.1 10*3/uL (ref 0–0.1)
Basophils Relative: 1 %
Eosinophils Absolute: 0.2 10*3/uL (ref 0–0.7)
Eosinophils Relative: 3 %
HEMATOCRIT: 41.3 % (ref 35.0–47.0)
Hemoglobin: 13.7 g/dL (ref 12.0–16.0)
LYMPHS ABS: 1.7 10*3/uL (ref 1.0–3.6)
LYMPHS PCT: 23 %
MCH: 29.7 pg (ref 26.0–34.0)
MCHC: 33.1 g/dL (ref 32.0–36.0)
MCV: 89.8 fL (ref 80.0–100.0)
MONOS PCT: 6 %
Monocytes Absolute: 0.5 10*3/uL (ref 0.2–0.9)
NEUTROS ABS: 4.9 10*3/uL (ref 1.4–6.5)
NEUTROS PCT: 67 %
Platelets: 345 10*3/uL (ref 150–440)
RBC: 4.6 MIL/uL (ref 3.80–5.20)
RDW: 14.4 % (ref 11.5–14.5)
WBC: 7.4 10*3/uL (ref 3.6–11.0)

## 2017-06-20 LAB — COMPREHENSIVE METABOLIC PANEL
ALT: 17 U/L (ref 14–54)
AST: 22 U/L (ref 15–41)
Albumin: 3.7 g/dL (ref 3.5–5.0)
Alkaline Phosphatase: 95 U/L (ref 38–126)
Anion gap: 11 (ref 5–15)
BUN: 45 mg/dL — ABNORMAL HIGH (ref 6–20)
CALCIUM: 9.5 mg/dL (ref 8.9–10.3)
CO2: 26 mmol/L (ref 22–32)
Chloride: 103 mmol/L (ref 101–111)
Creatinine, Ser: 1.03 mg/dL — ABNORMAL HIGH (ref 0.44–1.00)
GFR, EST NON AFRICAN AMERICAN: 56 mL/min — AB (ref >60)
Glucose, Bld: 117 mg/dL — ABNORMAL HIGH (ref 65–99)
Potassium: 4.7 mmol/L (ref 3.5–5.1)
SODIUM: 140 mmol/L (ref 135–145)
Total Bilirubin: 0.4 mg/dL (ref 0.3–1.2)
Total Protein: 7.8 g/dL (ref 6.5–8.1)

## 2017-06-20 LAB — TSH: TSH: 2.59 u[IU]/mL (ref 0.350–4.500)

## 2017-06-20 MED ORDER — TELMISARTAN 20 MG PO TABS: 20.0000 mg | ORAL_TABLET | Freq: Every day | ORAL | 1 refills | Status: DC

## 2017-06-20 MED ORDER — DIPHENOXYLATE-ATROPINE 2.5-0.025 MG PO TABS
1.0000 | ORAL_TABLET | Freq: Four times a day (QID) | ORAL | 0 refills | Status: DC | PRN
Start: 2017-06-20 — End: 2017-09-06

## 2017-06-20 NOTE — Progress Notes (Signed)
Date:  06/20/2017   Name:  Jeanette [name]   DOB:  July 17, 1950   MRN:  [id number]   Chief Complaint: Depression; Hot Flashes; Hypertension; and facial numbness Depression         This is a chronic problem.  Associated symptoms include decreased interest and sad.  Associated symptoms include no headaches and no suicidal ideas.     The symptoms are aggravated by social issues.  Past treatments include SSRIs - Selective serotonin reuptake inhibitors.  Previous treatment provided significant (but stopped medication due to wt gain) relief. Hypertension  This is a chronic problem. The problem has been gradually worsening since onset. The problem is uncontrolled. Pertinent negatives include no chest pain, headaches, palpitations or shortness of breath. Risk factors for coronary artery disease include sedentary lifestyle and obesity (has gained back almost 50 lbs since gastric bypass low weight). Past treatments include diuretics.  Diarrhea   This is a chronic problem. The current episode started more than 1 year ago. The problem has been waxing and waning. The stool consistency is described as watery. The patient states that diarrhea does not awaken her from sleep. Pertinent negatives include no abdominal pain, arthralgias, chills, fever or headaches. Nothing aggravates the symptoms. Risk factors: ever since cholecystectomy. She has tried anti-motility drug for the symptoms. The treatment provided mild relief.  Hot flashes - having more hot flashes and sweats.  Not doing as much per husband due to being hot.  We discussed medication such as effexor but she prefers to avoid additional medication if possible.    Review of Systems  Constitutional: Positive for diaphoresis. Negative for chills and fever.  Respiratory: Negative for chest tightness and shortness of breath.   Cardiovascular: Negative for chest pain, palpitations and leg swelling.  Gastrointestinal: Positive for diarrhea. Negative for  abdominal pain.  Musculoskeletal: Positive for gait problem. Negative for arthralgias.  Allergic/Immunologic: Positive for environmental allergies.  Neurological: Positive for dizziness. Negative for headaches.  Psychiatric/Behavioral: Positive for depression and dysphoric mood. Negative for sleep disturbance and suicidal ideas. The patient is not nervous/anxious.     Patient Active Problem List   Diagnosis Date Noted  . Diarrhea due to malabsorption 06/20/2017  . Special screening for malignant neoplasms, colon   . Benign neoplasm of descending colon   . Vitamin D deficiency 07/25/2016  . Depression 07/24/2016  . History of gastric bypass 07/24/2016  . Gait instability 07/24/2016  . Urticaria 07/24/2016  . External hemorrhoids 07/24/2016  . Obesity (BMI 30-39.9) 07/24/2016    Prior to Admission medications   Medication Sig Start Date End Date Taking? Authorizing Provider  Cholecalciferol (VITAMIN D3) 1000 units CAPS Take 1 capsule (1,000 Units total) by mouth daily. 07/25/16  Yes Plonk, Gwyndolyn Saxon, MD  loratadine (ALLERGY) 10 MG tablet Take 10 mg by mouth daily.   Yes [provider]  Multiple Vitamins-Minerals (CENTRUM SILVER PO) Take by mouth.   Yes [provider]  sertraline (ZOLOFT) 100 MG tablet Take 1 tablet (100 mg total) by mouth daily. Patient not taking: Reported on 06/20/2017 08/25/16   Adline Potter, MD    No Known Allergies  Past Surgical History:  Procedure Laterality Date  . ABDOMINAL HYSTERECTOMY    . APPENDECTOMY    . CHOLECYSTECTOMY    . COLONOSCOPY WITH PROPOFOL N/A 09/04/2016   Procedure: COLONOSCOPY WITH PROPOFOL;  Surgeon: Lucilla Lame, MD;  Location: [facility];  Service: Gastroenterology;  Laterality: N/A;  requests early  . GASTRIC BYPASS  06/20/2011  . HERNIA REPAIR    . POLYPECTOMY  09/04/2016   Procedure: POLYPECTOMY;  Surgeon: Lucilla Lame, MD;  Location: [place];  Service: Gastroenterology;;    Social History     Tobacco Use  . Smoking status: Never Smoker  . Smokeless tobacco: Never Used  Substance Use Topics  . Alcohol use: No  . Drug use: No     Medication list has been reviewed and updated.  Current Meds  Medication Sig  . Cholecalciferol (VITAMIN D3) 1000 units CAPS Take 1 capsule (1,000 Units total) by mouth daily.  Marland Kitchen loratadine (ALLERGY) 10 MG tablet Take 10 mg by mouth daily.  . Multiple Vitamins-Minerals (CENTRUM SILVER PO) Take by mouth.    PHQ 2/9 Scores 06/20/2017 08/25/2016  PHQ - 2 Score 3 0  PHQ- 9 Score 8 0    Physical Exam  Constitutional: She is oriented to person, place, and time. She appears well-developed. No distress.  HENT:  Head: Normocephalic and atraumatic.  Neck: Normal range of motion. Neck supple. Carotid bruit is not present.  Cardiovascular: Normal rate, regular rhythm and normal heart sounds.  Pulmonary/Chest: Effort normal and breath sounds normal. No respiratory distress.  Abdominal: Soft. Normal appearance. There is no tenderness.  Musculoskeletal: Normal range of motion.  Neurological: She is alert and oriented to person, place, and time.  Skin: Skin is warm and dry. No rash noted.  Psychiatric: She has a normal mood and affect. Her speech is normal and behavior is normal. Thought content normal.  Nursing note and vitals reviewed.   BP (!) 150/94   Pulse 98   Temp 97.6 F (36.4 C) (Oral)   Resp 16   Ht 5\' 7"  (1.702 m)   Wt 237 lb (107.5 kg)   SpO2 99%   BMI 37.12 kg/m   Assessment and Plan: 1. Essential hypertension Need to begin medication Discussed diet, weight loss, sodium restriction and exercise at length - telmisartan (MICARDIS) 20 MG tablet; Take 1 tablet (20 mg total) by mouth daily.  Dispense: 30 tablet; Refill: 1 - TSH - CBC with Differential/Platelet - Comprehensive metabolic panel  2. Moderate episode of recurrent major depressive disorder ([place]) Will hold off on medication for now - would consider effexor to help with  hot flashes as well Will reassess at next visit  3. History of gastric bypass stable  4. Diarrhea due to malabsorption Lomotil bid PRN - diphenoxylate-atropine (LOMOTIL) 2.5-0.025 MG tablet; Take 1 tablet by mouth 4 (four) times daily as needed for diarrhea or loose stools.  Dispense: 60 tablet; Refill: 0   Meds ordered this encounter  Medications  . diphenoxylate-atropine (LOMOTIL) 2.5-0.025 MG tablet    Sig: Take 1 tablet by mouth 4 (four) times daily as needed for diarrhea or loose stools.    Dispense:  60 tablet    Refill:  0  . telmisartan (MICARDIS) 20 MG tablet    Sig: Take 1 tablet (20 mg total) by mouth daily.    Dispense:  30 tablet    Refill:  1    Partially dictated using Editor, commissioning. Any errors are unintentional.  Halina Maidens, MD [facility]  06/20/2017   There are no diagnoses linked to this encounter.

## 2017-07-03 DIAGNOSIS — M5416 Radiculopathy, lumbar region: Secondary | ICD-10-CM | POA: Diagnosis not present

## 2017-07-03 DIAGNOSIS — M4306 Spondylolysis, lumbar region: Secondary | ICD-10-CM | POA: Diagnosis not present

## 2017-07-11 ENCOUNTER — Other Ambulatory Visit: Payer: Self-pay | Admitting: Internal Medicine

## 2017-07-11 DIAGNOSIS — M546 Pain in thoracic spine: Secondary | ICD-10-CM | POA: Diagnosis not present

## 2017-07-11 DIAGNOSIS — I10 Essential (primary) hypertension: Secondary | ICD-10-CM

## 2017-07-11 DIAGNOSIS — M545 Low back pain: Secondary | ICD-10-CM | POA: Diagnosis not present

## 2017-07-11 MED ORDER — TELMISARTAN 20 MG PO TABS: 20.0000 mg | ORAL_TABLET | Freq: Every day | ORAL | 0 refills | Status: DC

## 2017-08-01 ENCOUNTER — Ambulatory Visit: Payer: Medicare HMO | Admitting: Internal Medicine

## 2017-08-07 DIAGNOSIS — M5416 Radiculopathy, lumbar region: Secondary | ICD-10-CM | POA: Insufficient documentation

## 2017-09-06 ENCOUNTER — Ambulatory Visit (INDEPENDENT_AMBULATORY_CARE_PROVIDER_SITE_OTHER): Payer: Medicare HMO | Admitting: Internal Medicine

## 2017-09-06 ENCOUNTER — Encounter: Payer: Self-pay | Admitting: Internal Medicine

## 2017-09-06 VITALS — BP 128/74 | HR 68 | Ht 67.0 in | Wt 244.0 lb

## 2017-09-06 DIAGNOSIS — R197 Diarrhea, unspecified: Secondary | ICD-10-CM

## 2017-09-06 DIAGNOSIS — Z9884 Bariatric surgery status: Secondary | ICD-10-CM | POA: Diagnosis not present

## 2017-09-06 DIAGNOSIS — G8929 Other chronic pain: Secondary | ICD-10-CM

## 2017-09-06 DIAGNOSIS — M546 Pain in thoracic spine: Secondary | ICD-10-CM | POA: Diagnosis not present

## 2017-09-06 DIAGNOSIS — I10 Essential (primary) hypertension: Secondary | ICD-10-CM

## 2017-09-06 DIAGNOSIS — K909 Intestinal malabsorption, unspecified: Secondary | ICD-10-CM | POA: Diagnosis not present

## 2017-09-06 DIAGNOSIS — N62 Hypertrophy of breast: Secondary | ICD-10-CM

## 2017-09-06 MED ORDER — TELMISARTAN 20 MG PO TABS: 20.0000 mg | ORAL_TABLET | Freq: Every day | ORAL | 1 refills | Status: DC

## 2017-09-06 MED ORDER — DIPHENOXYLATE-ATROPINE 2.5-0.025 MG PO TABS: 1.0000 | ORAL_TABLET | Freq: Four times a day (QID) | ORAL | 5 refills | Status: DC | PRN

## 2017-09-06 NOTE — Progress Notes (Signed)
Date:  09/06/2017   Name:  Jeanette [name]   DOB:  1950/01/26   MRN:  [id number]   Chief Complaint: Hypertension and Diarrhea (Needs refills Lomotil.) Hypertension  This is a chronic problem. The problem has been rapidly improving since onset. The problem is controlled. Pertinent negatives include no chest pain, palpitations or shortness of breath. Past treatments include angiotensin blockers (started last visit).  Diarrhea   This is a chronic problem. The problem has been waxing and waning. Pertinent negatives include no chills or fever.  Back Pain  This is a chronic problem. The pain is present in the thoracic spine. The quality of the pain is described as aching and cramping. The pain is moderate. The pain is worse during the day. Pertinent negatives include no chest pain or fever. Risk factors: large breasts. She has tried ice, heat, chiropractic manipulation and muscle relaxant for the symptoms. The treatment provided no relief.  Flushing/hot episodes - feels very hot in her trunk then gets nauseated and sweats.  It lasts about 30 minutes and has to get in front of a fan. These are not related to food or activity.  She does not vomit.  She has not checked her BS, she admits to eating a very poor diet.  There is no associated chest pain or SOB, she has not passed out.  These occur about 2-3 times per week.    Review of Systems  Constitutional: Positive for diaphoresis. Negative for chills, fatigue and fever.  Respiratory: Negative for chest tightness, shortness of breath and wheezing.   Cardiovascular: Negative for chest pain and palpitations.  Gastrointestinal: Positive for diarrhea.  Endocrine: Positive for heat intolerance.  Musculoskeletal: Positive for back pain.    Patient Active Problem List   Diagnosis Date Noted  . Diarrhea due to malabsorption 06/20/2017  . Essential hypertension 06/20/2017  . Special screening for malignant neoplasms, colon   . Benign neoplasm of  descending colon   . Vitamin D deficiency 07/25/2016  . Depression 07/24/2016  . History of gastric bypass 07/24/2016  . Gait instability 07/24/2016  . Urticaria 07/24/2016  . External hemorrhoids 07/24/2016  . Obesity (BMI 30-39.9) 07/24/2016    No Known Allergies  Past Surgical History:  Procedure Laterality Date  . ABDOMINAL HYSTERECTOMY    . APPENDECTOMY    . CHOLECYSTECTOMY    . COLONOSCOPY WITH PROPOFOL N/A 09/04/2016   Procedure: COLONOSCOPY WITH PROPOFOL;  Surgeon: Lucilla Lame, MD;  Location: [facility];  Service: Gastroenterology;  Laterality: N/A;  requests early  . GASTRIC BYPASS  06/20/2011  . HERNIA REPAIR    . POLYPECTOMY  09/04/2016   Procedure: POLYPECTOMY;  Surgeon: Lucilla Lame, MD;  Location: [place];  Service: Gastroenterology;;    Social History   Tobacco Use  . Smoking status: Never Smoker  . Smokeless tobacco: Never Used  Substance Use Topics  . Alcohol use: No  . Drug use: No     Medication list has been reviewed and updated.  Current Meds  Medication Sig  . Cholecalciferol (VITAMIN D3) 1000 units CAPS Take 1 capsule (1,000 Units total) by mouth daily.  . diphenoxylate-atropine (LOMOTIL) 2.5-0.025 MG tablet Take 1 tablet by mouth 4 (four) times daily as needed for diarrhea or loose stools.  Marland Kitchen loratadine (ALLERGY) 10 MG tablet Take 10 mg by mouth daily.  . Multiple Vitamins-Minerals (CENTRUM SILVER PO) Take by mouth.  . telmisartan (MICARDIS) 20 MG tablet Take 1 tablet (20 mg total) by  mouth daily.    PHQ 2/9 Scores 06/20/2017 08/25/2016  PHQ - 2 Score 3 0  PHQ- 9 Score 8 0    Physical Exam  Constitutional: She is oriented to person, place, and time. She appears well-developed. No distress.  HENT:  Head: Normocephalic and atraumatic.  Neck: Normal range of motion. Neck supple.  Cardiovascular: Normal rate, regular rhythm and normal heart sounds.  Pulmonary/Chest: Effort normal and breath sounds normal. No respiratory  distress.  Musculoskeletal: She exhibits no edema.       Thoracic back: She exhibits tenderness and spasm.  Lymphadenopathy:    She has no cervical adenopathy.  Neurological: She is alert and oriented to person, place, and time.  Skin: Skin is warm and dry. No rash noted.  Psychiatric: She has a normal mood and affect. Her behavior is normal. Thought content normal.  Nursing note and vitals reviewed.   BP 128/74 (BP Location: Right Arm, Patient Position: Sitting, Cuff Size: Normal)   Pulse 68   Ht 5\' 7"  (1.702 m)   Wt 244 lb (110.7 kg)   SpO2 97%   BMI 38.22 kg/m   Assessment and Plan: 1. Essential hypertension controlled - telmisartan (MICARDIS) 20 MG tablet; Take 1 tablet (20 mg total) by mouth daily.  Dispense: 90 tablet; Refill: 1  2. 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  Dispense: 60 tablet; Refill: 5  3. History of gastric bypass Need to resume healthy diet, low carb and small meals Suspect episodes are low blood sugar - check with glucometer   4. Chronic midline thoracic back pain - Ambulatory referral to Plastic Surgery  5. Breast hypertrophy in female - Ambulatory referral to Plastic Surgery   Meds ordered this encounter  Medications  . diphenoxylate-atropine (LOMOTIL) 2.5-0.025 MG tablet    Sig: Take 1 tablet by mouth 4 (four) times daily as needed for diarrhea or loose stools.    Dispense:  60 tablet    Refill:  5  . telmisartan (MICARDIS) 20 MG tablet    Sig: Take 1 tablet (20 mg total) by mouth daily.    Dispense:  90 tablet    Refill:  1    Partially dictated using Editor, commissioning. Any errors are unintentional.  Halina Maidens, MD [facility]  09/06/2017   There are no diagnoses linked to this encounter.

## 2017-09-06 NOTE — Patient Instructions (Signed)
Check Blood sugar intermittently and when episodes occur

## 2017-09-26 ENCOUNTER — Telehealth: Payer: Self-pay

## 2017-09-26 DIAGNOSIS — M542 Cervicalgia: Secondary | ICD-10-CM | POA: Diagnosis not present

## 2017-09-26 DIAGNOSIS — N62 Hypertrophy of breast: Secondary | ICD-10-CM | POA: Diagnosis not present

## 2017-09-26 DIAGNOSIS — M546 Pain in thoracic spine: Secondary | ICD-10-CM | POA: Diagnosis not present

## 2017-09-26 NOTE — Telephone Encounter (Signed)
Faxed notes to plastic surgery

## 2017-10-02 ENCOUNTER — Other Ambulatory Visit: Payer: Self-pay | Admitting: Internal Medicine

## 2017-10-02 DIAGNOSIS — Z1231 Encounter for screening mammogram for malignant neoplasm of breast: Secondary | ICD-10-CM

## 2017-10-11 ENCOUNTER — Ambulatory Visit
Admission: RE | Admit: 2017-10-11 | Discharge: 2017-10-11 | Disposition: A | Discharge status: Home / Self Care | Payer: Medicare HMO | Source: Ambulatory Visit | Attending: Internal Medicine | Admitting: Internal Medicine

## 2017-10-11 DIAGNOSIS — Z1231 Encounter for screening mammogram for malignant neoplasm of breast: Secondary | ICD-10-CM

## 2017-10-17 ENCOUNTER — Ambulatory Visit: Payer: Medicare HMO

## 2017-10-19 DIAGNOSIS — M9902 Segmental and somatic dysfunction of thoracic region: Secondary | ICD-10-CM | POA: Diagnosis not present

## 2017-10-19 DIAGNOSIS — M9903 Segmental and somatic dysfunction of lumbar region: Secondary | ICD-10-CM | POA: Diagnosis not present

## 2017-10-19 DIAGNOSIS — M9905 Segmental and somatic dysfunction of pelvic region: Secondary | ICD-10-CM | POA: Diagnosis not present

## 2017-10-19 DIAGNOSIS — M9901 Segmental and somatic dysfunction of cervical region: Secondary | ICD-10-CM | POA: Diagnosis not present

## 2017-10-19 DIAGNOSIS — R51 Headache: Secondary | ICD-10-CM | POA: Diagnosis not present

## 2017-10-19 DIAGNOSIS — M791 Myalgia, unspecified site: Secondary | ICD-10-CM | POA: Diagnosis not present

## 2017-10-19 DIAGNOSIS — M5430 Sciatica, unspecified side: Secondary | ICD-10-CM | POA: Diagnosis not present

## 2017-10-19 DIAGNOSIS — M6240 Contracture of muscle, unspecified site: Secondary | ICD-10-CM | POA: Diagnosis not present

## 2017-10-21 DIAGNOSIS — R69 Illness, unspecified: Secondary | ICD-10-CM | POA: Diagnosis not present

## 2017-10-26 DIAGNOSIS — M9903 Segmental and somatic dysfunction of lumbar region: Secondary | ICD-10-CM | POA: Diagnosis not present

## 2017-10-26 DIAGNOSIS — M791 Myalgia, unspecified site: Secondary | ICD-10-CM | POA: Diagnosis not present

## 2017-10-26 DIAGNOSIS — R51 Headache: Secondary | ICD-10-CM | POA: Diagnosis not present

## 2017-10-26 DIAGNOSIS — M9902 Segmental and somatic dysfunction of thoracic region: Secondary | ICD-10-CM | POA: Diagnosis not present

## 2017-10-26 DIAGNOSIS — M9901 Segmental and somatic dysfunction of cervical region: Secondary | ICD-10-CM | POA: Diagnosis not present

## 2017-10-26 DIAGNOSIS — M6240 Contracture of muscle, unspecified site: Secondary | ICD-10-CM | POA: Diagnosis not present

## 2017-10-26 DIAGNOSIS — M9905 Segmental and somatic dysfunction of pelvic region: Secondary | ICD-10-CM | POA: Diagnosis not present

## 2017-10-26 DIAGNOSIS — M5430 Sciatica, unspecified side: Secondary | ICD-10-CM | POA: Diagnosis not present

## 2017-11-02 DIAGNOSIS — R51 Headache: Secondary | ICD-10-CM | POA: Diagnosis not present

## 2017-11-02 DIAGNOSIS — M5430 Sciatica, unspecified side: Secondary | ICD-10-CM | POA: Diagnosis not present

## 2017-11-02 DIAGNOSIS — M9902 Segmental and somatic dysfunction of thoracic region: Secondary | ICD-10-CM | POA: Diagnosis not present

## 2017-11-02 DIAGNOSIS — M9901 Segmental and somatic dysfunction of cervical region: Secondary | ICD-10-CM | POA: Diagnosis not present

## 2017-11-02 DIAGNOSIS — M6240 Contracture of muscle, unspecified site: Secondary | ICD-10-CM | POA: Diagnosis not present

## 2017-11-02 DIAGNOSIS — M791 Myalgia, unspecified site: Secondary | ICD-10-CM | POA: Diagnosis not present

## 2017-11-02 DIAGNOSIS — M9905 Segmental and somatic dysfunction of pelvic region: Secondary | ICD-10-CM | POA: Diagnosis not present

## 2017-11-02 DIAGNOSIS — M9903 Segmental and somatic dysfunction of lumbar region: Secondary | ICD-10-CM | POA: Diagnosis not present

## 2017-11-09 DIAGNOSIS — M9901 Segmental and somatic dysfunction of cervical region: Secondary | ICD-10-CM | POA: Diagnosis not present

## 2017-11-09 DIAGNOSIS — M9902 Segmental and somatic dysfunction of thoracic region: Secondary | ICD-10-CM | POA: Diagnosis not present

## 2017-11-09 DIAGNOSIS — M5430 Sciatica, unspecified side: Secondary | ICD-10-CM | POA: Diagnosis not present

## 2017-11-09 DIAGNOSIS — M791 Myalgia, unspecified site: Secondary | ICD-10-CM | POA: Diagnosis not present

## 2017-11-09 DIAGNOSIS — M9905 Segmental and somatic dysfunction of pelvic region: Secondary | ICD-10-CM | POA: Diagnosis not present

## 2017-11-09 DIAGNOSIS — M9903 Segmental and somatic dysfunction of lumbar region: Secondary | ICD-10-CM | POA: Diagnosis not present

## 2017-11-09 DIAGNOSIS — M6240 Contracture of muscle, unspecified site: Secondary | ICD-10-CM | POA: Diagnosis not present

## 2017-11-09 DIAGNOSIS — R51 Headache: Secondary | ICD-10-CM | POA: Diagnosis not present

## 2017-11-21 DIAGNOSIS — M546 Pain in thoracic spine: Secondary | ICD-10-CM | POA: Diagnosis not present

## 2017-11-21 DIAGNOSIS — N62 Hypertrophy of breast: Secondary | ICD-10-CM | POA: Diagnosis not present

## 2017-11-21 DIAGNOSIS — M542 Cervicalgia: Secondary | ICD-10-CM | POA: Diagnosis not present

## 2018-01-23 ENCOUNTER — Encounter (HOSPITAL_BASED_OUTPATIENT_CLINIC_OR_DEPARTMENT_OTHER): Payer: Self-pay | Admitting: *Deleted

## 2018-01-23 ENCOUNTER — Other Ambulatory Visit: Payer: Self-pay

## 2018-01-24 ENCOUNTER — Encounter (HOSPITAL_BASED_OUTPATIENT_CLINIC_OR_DEPARTMENT_OTHER)
Admission: RE | Admit: 2018-01-24 | Discharge: 2018-01-24 | Disposition: A | Discharge status: Home / Self Care | Payer: Medicare HMO | Source: Ambulatory Visit | Attending: Specialist | Admitting: Specialist

## 2018-01-24 DIAGNOSIS — Z01810 Encounter for preprocedural cardiovascular examination: Secondary | ICD-10-CM | POA: Insufficient documentation

## 2018-01-24 NOTE — Progress Notes (Signed)
EKG reviewed by Dr. Singer, will proceed with surgery as scheduled.  

## 2018-01-28 ENCOUNTER — Encounter (HOSPITAL_BASED_OUTPATIENT_CLINIC_OR_DEPARTMENT_OTHER)
Admission: RE | Disposition: A | Discharge status: Home / Self Care | Payer: Self-pay | Source: Home / Self Care | Attending: Specialist

## 2018-01-28 ENCOUNTER — Other Ambulatory Visit: Payer: Self-pay

## 2018-01-28 ENCOUNTER — Ambulatory Visit (HOSPITAL_BASED_OUTPATIENT_CLINIC_OR_DEPARTMENT_OTHER): Payer: Medicare HMO | Admitting: Anesthesiology

## 2018-01-28 ENCOUNTER — Encounter (HOSPITAL_BASED_OUTPATIENT_CLINIC_OR_DEPARTMENT_OTHER): Payer: Self-pay | Admitting: *Deleted

## 2018-01-28 ENCOUNTER — Ambulatory Visit (HOSPITAL_BASED_OUTPATIENT_CLINIC_OR_DEPARTMENT_OTHER)
Admission: RE | Admit: 2018-01-28 | Discharge: 2018-01-28 | Disposition: A | Discharge status: Home / Self Care | Payer: Medicare HMO | Attending: Specialist | Admitting: Specialist

## 2018-01-28 DIAGNOSIS — M542 Cervicalgia: Secondary | ICD-10-CM | POA: Diagnosis not present

## 2018-01-28 DIAGNOSIS — N62 Hypertrophy of breast: Secondary | ICD-10-CM | POA: Diagnosis not present

## 2018-01-28 DIAGNOSIS — Z9884 Bariatric surgery status: Secondary | ICD-10-CM | POA: Insufficient documentation

## 2018-01-28 DIAGNOSIS — I10 Essential (primary) hypertension: Secondary | ICD-10-CM | POA: Diagnosis not present

## 2018-01-28 DIAGNOSIS — Z79899 Other long term (current) drug therapy: Secondary | ICD-10-CM | POA: Insufficient documentation

## 2018-01-28 DIAGNOSIS — G4730 Sleep apnea, unspecified: Secondary | ICD-10-CM | POA: Diagnosis not present

## 2018-01-28 DIAGNOSIS — M546 Pain in thoracic spine: Secondary | ICD-10-CM | POA: Diagnosis not present

## 2018-01-28 HISTORY — DX: Depression, unspecified: F32.A

## 2018-01-28 HISTORY — DX: Anxiety disorder, unspecified: F41.9

## 2018-01-28 HISTORY — PX: BREAST REDUCTION SURGERY: SHX8

## 2018-01-28 HISTORY — DX: Sleep apnea, unspecified: G47.30

## 2018-01-28 HISTORY — DX: Major depressive disorder, single episode, unspecified: F32.9

## 2018-01-28 HISTORY — DX: Hypertrophy of breast: N62

## 2018-01-28 SURGERY — MAMMOPLASTY, REDUCTION
Anesthesia: General | Site: Breast | Laterality: Bilateral

## 2018-01-28 MED ORDER — FENTANYL CITRATE (PF) 100 MCG/2ML IJ SOLN
50.0000 ug | INTRAMUSCULAR | Status: AC | PRN
  Administered 2018-01-28: 25 ug via INTRAVENOUS
  Administered 2018-01-28: 100 ug via INTRAVENOUS
  Administered 2018-01-28: 25 ug via INTRAVENOUS

## 2018-01-28 MED ORDER — SODIUM CHLORIDE 0.9 % IV SOLN
INTRAVENOUS | Status: DC | PRN
  Administered 2018-01-28: 50 ug/min via INTRAVENOUS

## 2018-01-28 MED ORDER — PHENYLEPHRINE HCL 10 MG/ML IJ SOLN
INTRAMUSCULAR | Status: AC
  Filled 2018-01-28: qty 1

## 2018-01-28 MED ORDER — LACTATED RINGERS IV SOLN
INTRAVENOUS | Status: AC | PRN
  Administered 2018-01-28: 1000 mL via INTRAVENOUS

## 2018-01-28 MED ORDER — ACETAMINOPHEN 500 MG PO TABS: 1000.0000 mg | ORAL_TABLET | Freq: Once | ORAL | Status: DC | PRN

## 2018-01-28 MED ORDER — SUGAMMADEX SODIUM 200 MG/2ML IV SOLN
INTRAVENOUS | Status: AC
  Filled 2018-01-28: qty 2

## 2018-01-28 MED ORDER — MIDAZOLAM HCL 2 MG/2ML IJ SOLN
INTRAMUSCULAR | Status: AC
  Filled 2018-01-28: qty 2

## 2018-01-28 MED ORDER — PROPOFOL 500 MG/50ML IV EMUL
INTRAVENOUS | Status: AC
  Filled 2018-01-28: qty 50

## 2018-01-28 MED ORDER — OXYCODONE HCL 5 MG PO TABS
ORAL_TABLET | ORAL | Status: AC
  Filled 2018-01-28: qty 1

## 2018-01-28 MED ORDER — SCOPOLAMINE 1 MG/3DAYS TD PT72: 1.0000 | MEDICATED_PATCH | Freq: Once | TRANSDERMAL | Status: DC | PRN

## 2018-01-28 MED ORDER — FENTANYL CITRATE (PF) 100 MCG/2ML IJ SOLN: 25.0000 ug | INTRAMUSCULAR | Status: DC | PRN

## 2018-01-28 MED ORDER — FENTANYL CITRATE (PF) 100 MCG/2ML IJ SOLN
INTRAMUSCULAR | Status: AC
  Filled 2018-01-28: qty 2

## 2018-01-28 MED ORDER — GLYCOPYRROLATE PF 0.2 MG/ML IJ SOSY
PREFILLED_SYRINGE | INTRAMUSCULAR | Status: DC | PRN
  Administered 2018-01-28: .2 mg via INTRAVENOUS

## 2018-01-28 MED ORDER — LIDOCAINE HCL 2 % IJ SOLN
INTRAMUSCULAR | Status: AC
  Filled 2018-01-28: qty 100

## 2018-01-28 MED ORDER — PHENYLEPHRINE 40 MCG/ML (10ML) SYRINGE FOR IV PUSH (FOR BLOOD PRESSURE SUPPORT)
PREFILLED_SYRINGE | INTRAVENOUS | Status: DC | PRN
  Administered 2018-01-28 (×5): 80 ug via INTRAVENOUS

## 2018-01-28 MED ORDER — PROPOFOL 10 MG/ML IV BOLUS
INTRAVENOUS | Status: DC | PRN
  Administered 2018-01-28: 60 mg via INTRAVENOUS
  Administered 2018-01-28: 130 mg via INTRAVENOUS
  Administered 2018-01-28: 60 mg via INTRAVENOUS

## 2018-01-28 MED ORDER — LIDOCAINE 2% (20 MG/ML) 5 ML SYRINGE
INTRAMUSCULAR | Status: AC
Start: 2018-01-28 — End: ?
  Filled 2018-01-28: qty 5

## 2018-01-28 MED ORDER — SUGAMMADEX SODIUM 200 MG/2ML IV SOLN
INTRAVENOUS | Status: DC | PRN
  Administered 2018-01-28: 200 mg via INTRAVENOUS

## 2018-01-28 MED ORDER — ONDANSETRON HCL 4 MG/2ML IJ SOLN
INTRAMUSCULAR | Status: DC | PRN
  Administered 2018-01-28: 4 mg via INTRAVENOUS

## 2018-01-28 MED ORDER — ROCURONIUM BROMIDE 100 MG/10ML IV SOLN
INTRAVENOUS | Status: DC | PRN
  Administered 2018-01-28: 50 mg via INTRAVENOUS

## 2018-01-28 MED ORDER — CEFAZOLIN SODIUM-DEXTROSE 2-4 GM/100ML-% IV SOLN
INTRAVENOUS | Status: AC
  Filled 2018-01-28: qty 100

## 2018-01-28 MED ORDER — OXYCODONE HCL 5 MG/5ML PO SOLN: 5.0000 mg | Freq: Once | ORAL | Status: AC | PRN

## 2018-01-28 MED ORDER — LIDOCAINE HCL 2 % IJ SOLN
INTRAMUSCULAR | Status: DC | PRN
  Administered 2018-01-28: 100 mL

## 2018-01-28 MED ORDER — ROCURONIUM BROMIDE 50 MG/5ML IV SOSY
PREFILLED_SYRINGE | INTRAVENOUS | Status: AC
  Filled 2018-01-28: qty 5

## 2018-01-28 MED ORDER — DEXAMETHASONE SODIUM PHOSPHATE 10 MG/ML IJ SOLN
INTRAMUSCULAR | Status: AC
  Filled 2018-01-28: qty 1

## 2018-01-28 MED ORDER — ONDANSETRON HCL 4 MG/2ML IJ SOLN
INTRAMUSCULAR | Status: AC
  Filled 2018-01-28: qty 4

## 2018-01-28 MED ORDER — GLYCOPYRROLATE PF 0.2 MG/ML IJ SOSY
PREFILLED_SYRINGE | INTRAMUSCULAR | Status: AC
  Filled 2018-01-28: qty 1

## 2018-01-28 MED ORDER — EPINEPHRINE 30 MG/30ML IJ SOLN
INTRAMUSCULAR | Status: AC
  Filled 2018-01-28: qty 1

## 2018-01-28 MED ORDER — CHLORHEXIDINE GLUCONATE CLOTH 2 % EX PADS: 6.0000 | MEDICATED_PAD | Freq: Once | CUTANEOUS | Status: DC

## 2018-01-28 MED ORDER — PHENYLEPHRINE 40 MCG/ML (10ML) SYRINGE FOR IV PUSH (FOR BLOOD PRESSURE SUPPORT)
PREFILLED_SYRINGE | INTRAVENOUS | Status: AC
  Filled 2018-01-28: qty 10

## 2018-01-28 MED ORDER — EPINEPHRINE PF 1 MG/ML IJ SOLN
INTRAMUSCULAR | Status: DC | PRN
  Administered 2018-01-28: 1 mg

## 2018-01-28 MED ORDER — ACETAMINOPHEN 160 MG/5ML PO SOLN: 1000.0000 mg | Freq: Once | ORAL | Status: DC | PRN

## 2018-01-28 MED ORDER — MIDAZOLAM HCL 2 MG/2ML IJ SOLN
1.0000 mg | INTRAMUSCULAR | Status: DC | PRN
  Administered 2018-01-28: 2 mg via INTRAVENOUS

## 2018-01-28 MED ORDER — DEXAMETHASONE SODIUM PHOSPHATE 4 MG/ML IJ SOLN
INTRAMUSCULAR | Status: DC | PRN
  Administered 2018-01-28: 10 mg via INTRAVENOUS

## 2018-01-28 MED ORDER — OXYCODONE HCL 5 MG PO TABS
5.0000 mg | ORAL_TABLET | Freq: Once | ORAL | Status: AC | PRN
  Administered 2018-01-28: 5 mg via ORAL

## 2018-01-28 MED ORDER — LIDOCAINE HCL (CARDIAC) PF 100 MG/5ML IV SOSY
PREFILLED_SYRINGE | INTRAVENOUS | Status: DC | PRN
  Administered 2018-01-28: 60 mg via INTRAVENOUS

## 2018-01-28 MED ORDER — ACETAMINOPHEN 10 MG/ML IV SOLN: 1000.0000 mg | Freq: Once | INTRAVENOUS | Status: DC | PRN

## 2018-01-28 MED ORDER — CEFAZOLIN SODIUM-DEXTROSE 2-4 GM/100ML-% IV SOLN
2.0000 g | INTRAVENOUS | Status: AC
  Administered 2018-01-28: 2 g via INTRAVENOUS

## 2018-01-28 MED ORDER — LACTATED RINGERS IV SOLN
INTRAVENOUS | Status: DC
  Administered 2018-01-28 (×2): via INTRAVENOUS

## 2018-01-28 SURGICAL SUPPLY — 65 items
BAG DECANTER FOR FLEXI CONT (MISCELLANEOUS) IMPLANT
BENZOIN TINCTURE PRP APPL 2/3 (GAUZE/BANDAGES/DRESSINGS) ×4 IMPLANT
BINDER BREAST XLRG (GAUZE/BANDAGES/DRESSINGS) IMPLANT
BINDER BREAST XXLRG (GAUZE/BANDAGES/DRESSINGS) IMPLANT
BLADE KNIFE PERSONA 10 (BLADE) ×8 IMPLANT
BLADE KNIFE PERSONA 15 (BLADE) ×4 IMPLANT
CANISTER SUCT 1200ML W/VALVE (MISCELLANEOUS) ×2 IMPLANT
COVER BACK TABLE 60X90IN (DRAPES) ×2 IMPLANT
COVER MAYO STAND STRL (DRAPES) ×2 IMPLANT
COVER WAND RF STERILE (DRAPES) IMPLANT
DECANTER SPIKE VIAL GLASS SM (MISCELLANEOUS) IMPLANT
DRAIN CHANNEL 10F 3/8 F FF (DRAIN) ×4 IMPLANT
DRAPE LAPAROSCOPIC ABDOMINAL (DRAPES) ×2 IMPLANT
DRSG PAD ABDOMINAL 8X10 ST (GAUZE/BANDAGES/DRESSINGS) ×8 IMPLANT
ELECT REM PT RETURN 9FT ADLT (ELECTROSURGICAL) ×2
ELECTRODE REM PT RTRN 9FT ADLT (ELECTROSURGICAL) ×1 IMPLANT
EVACUATOR SILICONE 100CC (DRAIN) ×4 IMPLANT
GAUZE SPONGE 4X4 12PLY STRL (GAUZE/BANDAGES/DRESSINGS) ×4 IMPLANT
GAUZE XEROFORM 5X9 LF (GAUZE/BANDAGES/DRESSINGS) ×4 IMPLANT
GLOVE BIO SURGEON STRL SZ 6.5 (GLOVE) IMPLANT
GLOVE BIOGEL M STRL SZ7.5 (GLOVE) IMPLANT
GLOVE BIOGEL PI IND STRL 8 (GLOVE) ×3 IMPLANT
GLOVE BIOGEL PI INDICATOR 8 (GLOVE) ×3
GLOVE ECLIPSE 7.0 STRL STRAW (GLOVE) ×2 IMPLANT
GLOVE SURG SYN 8.0 (GLOVE) ×2 IMPLANT
GOWN STRL REIN XL XLG (GOWN DISPOSABLE) ×2 IMPLANT
GOWN STRL REUS W/ TWL XL LVL3 (GOWN DISPOSABLE) ×1 IMPLANT
GOWN STRL REUS W/TWL XL LVL3 (GOWN DISPOSABLE) ×1
IV NS 500ML (IV SOLUTION)
IV NS 500ML BAXH (IV SOLUTION) IMPLANT
MARKER SKIN DUAL TIP RULER LAB (MISCELLANEOUS) ×2 IMPLANT
NEEDLE SPNL 18GX3.5 QUINCKE PK (NEEDLE) IMPLANT
NS IRRIG 1000ML POUR BTL (IV SOLUTION) ×2 IMPLANT
PACK BASIN DAY SURGERY FS (CUSTOM PROCEDURE TRAY) ×2 IMPLANT
PAD ALCOHOL SWAB (MISCELLANEOUS) ×16 IMPLANT
PEN SKIN MARKING BROAD TIP (MISCELLANEOUS) ×4 IMPLANT
PILLOW FOAM RUBBER ADULT (PILLOWS) ×2 IMPLANT
PIN SAFETY STERILE (MISCELLANEOUS) ×2 IMPLANT
SHEET MEDIUM DRAPE 40X70 STRL (DRAPES) ×2 IMPLANT
SHEETING SILICONE GEL EPI DERM (MISCELLANEOUS) IMPLANT
SLEEVE SCD COMPRESS KNEE MED (MISCELLANEOUS) ×2 IMPLANT
SPECIMEN JAR MEDIUM (MISCELLANEOUS) IMPLANT
SPECIMEN JAR X LARGE (MISCELLANEOUS) ×4 IMPLANT
SPONGE LAP 18X18 RF (DISPOSABLE) ×8 IMPLANT
STRIP SUTURE WOUND CLOSURE 1/2 (SUTURE) ×10 IMPLANT
SUT MNCRL AB 3-0 PS2 18 (SUTURE) ×12 IMPLANT
SUT MON AB 2-0 CT1 36 (SUTURE) IMPLANT
SUT MON AB 5-0 PS2 18 (SUTURE) ×4 IMPLANT
SUT PROLENE 3 0 PS 2 (SUTURE) ×12 IMPLANT
SUT VLOC 180 0 24IN GS25 (SUTURE) IMPLANT
SUT VLOC 90 P-14 23 (SUTURE) ×4 IMPLANT
SYR 50ML LL SCALE MARK (SYRINGE) ×2 IMPLANT
SYR CONTROL 10ML LL (SYRINGE) IMPLANT
SYR TB 1ML LL NO SAFETY (SYRINGE) ×2 IMPLANT
TAPE HYPAFIX 6X30 (GAUZE/BANDAGES/DRESSINGS) ×2 IMPLANT
TAPE MEASURE 72IN RETRACT (INSTRUMENTS) ×1
TAPE MEASURE LINEN 72IN RETRCT (INSTRUMENTS) ×1 IMPLANT
TAPE PAPER 1X10 WHT MICROPORE (GAUZE/BANDAGES/DRESSINGS) ×2 IMPLANT
TOWEL GREEN STERILE FF (TOWEL DISPOSABLE) ×4 IMPLANT
TUBE CONNECTING 20X1/4 (TUBING) ×2 IMPLANT
TUBING INFILTRATION IT-10001 (TUBING) IMPLANT
TUBING SET GRADUATE ASPIR 12FT (MISCELLANEOUS) ×2 IMPLANT
UNDERPAD 30X30 (UNDERPADS AND DIAPERS) ×4 IMPLANT
VAC PENCILS W/TUBING CLEAR (MISCELLANEOUS) ×2 IMPLANT
YANKAUER SUCT BULB TIP NO VENT (SUCTIONS) ×2 IMPLANT

## 2018-01-28 NOTE — H&P (Signed)
Jeanette [name] is an 68 y.o. female.   Chief Complaint: Severe macromastia [id number] back and shoulder pain intertrigo  Past Medical History:  Diagnosis Date  . Anxiety   . Depression   . Family history of adverse reaction to anesthesia    Sister - slow to wake  . Heart murmur    followed by PCP  . Hypertension    before gastric bypass  . Knee pain   . Macromastia   . Sleep apnea    had gastric bypass and lost wt, not using now  . Vertigo    no episodes in over 2 yrs  . Wears dentures    full upper and lower    Past Surgical History:  Procedure Laterality Date  . ABDOMINAL HYSTERECTOMY    . APPENDECTOMY    . CHOLECYSTECTOMY    . COLONOSCOPY WITH PROPOFOL N/A 09/04/2016   Procedure: COLONOSCOPY WITH PROPOFOL;  Surgeon: Lucilla Lame, MD;  Location: [place];  Service: Gastroenterology;  Laterality: N/A;  requests early  . GASTRIC BYPASS  06/20/2011  . HERNIA REPAIR    . POLYPECTOMY  09/04/2016   Procedure: POLYPECTOMY;  Surgeon: Lucilla Lame, MD;  Location: [place];  Service: Gastroenterology;;    Family History  Problem Relation Age of Onset  . Cancer Father        stomach   Social History:  reports that she has never smoked. She has never used smokeless tobacco. She reports that she does not drink alcohol or use drugs.  Allergies: No Known Allergies  Medications Prior to Admission  Medication Sig Dispense Refill  . Biotin w/ Vitamins C & E (HAIR/SKIN/NAILS PO) Take by mouth.    . Cholecalciferol (VITAMIN D3) 1000 units CAPS Take 1 capsule (1,000 Units total) by mouth daily. 60 capsule   . diphenoxylate-atropine (LOMOTIL) 2.5-0.025 MG tablet Take 1 tablet by mouth 4 (four) times daily as needed for diarrhea or loose stools. 60 tablet 5  . loratadine (ALLERGY) 10 MG tablet Take 10 mg by mouth daily.    . Multiple Vitamins-Minerals (CENTRUM SILVER PO) Take by mouth.    . telmisartan (MICARDIS) 20 MG tablet Take 1 tablet (20 mg total) by  mouth daily. 90 tablet 1    No results found for this or any previous visit (from the past 48 hour(s)). No results found.  Review of Systems  Constitutional: Negative.   HENT: Negative.   Eyes: Negative.   Respiratory: Negative.   Cardiovascular: Negative.   Gastrointestinal: Negative.   Genitourinary: Negative.   Musculoskeletal: Positive for back pain, myalgias and neck pain.  Skin: Positive for rash.  Neurological: Negative.   Endo/Heme/Allergies: Negative.   Psychiatric/Behavioral: Negative.     Blood pressure (!) 163/107, pulse (S) (!) 122, temperature 97.7 F (36.5 C), temperature source Oral, resp. rate 18, height 5\' 6"  (1.676 m), weight 110.2 kg, SpO2 96 %. Physical Exam  Constitutional: She appears well-developed and well-nourished.  HENT:  Head: Normocephalic.  Eyes: Pupils are equal, round, and reactive to light. EOM are normal.  Neck: Normal range of motion.  Cardiovascular: Normal rate and regular rhythm.  Respiratory: Effort normal.  GI: Soft.  Skin: Skin is warm.  Psychiatric: She has a normal mood and affect. Her behavior is normal.     Assessment/Plan Severe macromastia for bilateral reast reductions  Amit Leece L, MD 01/28/2018, 7:17 AM

## 2018-01-28 NOTE — Op Note (Signed)
NAME: Jeanette [name], GERETY MEDICAL RECORD IP:[id number] ACCOUNT [id number] DATE OF BIRTH:[birth year] FACILITY: [facility] LOCATION: [facility] PHYSICIAN:Brenlyn Beshara Octavia Heir, MD  OPERATIVE REPORT  DATE OF PROCEDURE:  01/28/2018  INDICATIONS:  68 year old lady with severe macromastia back and shoulder pain secondary to large pendulous breasts with increase of hypertrophic breast tissue and  accessory breast tissue.  PROCEDURES DONE:  Bilateral breast reductions using the inferior pedicle technique; reduction of superior and the excessive breast tissue.  ANESTHESIA:  General.  DESCRIPTION OF PROCEDURE:  Preoperatively, the patient was set up and drawn for the reduction mammoplasty.  We marked the nipple areolar complex from over 40 cm to 24 cm from the suprasternal notch to the periareolar areas.  After this, she underwent  general anesthesia, intubated orally.  Prep was done to the chest, breast areas in routine fashion using Hibiclens soap and solution walled off with sterile towels and drapes so as to make a sterile field.  The previous markings were outlined with a  sterile marking pen.  Tumescent solution was injected into both breast areas, 500 cc per side.  This was allowed to sit up.  Next, attention was drawn to the markings and the markings were scored with a #15 blade.  The skin of the inferior pedicle was  deepithelialized #20 blade.  Medial and lateral fatty dermal pedicles were excised down to underlying fascia and the new keyhole area was also debulked.  Hemostasis was maintained with the Bovie anticoagulation out laterally where more tissue was removed  to give a more symmetrical appearance upper accessory breast tissue excision.  After proper hemostasis, the stay sutures of 3-0 Prolene were placed.  Subcutaneous closure was done with 3-0 Monocryl x2 layers and running subcuticular stitches of 3-0  Monocryl throughout the inverted T.  The wounds were drained with #10 fully fluted Blake  drains which were placed in the depths of the wound and brought out the lateral most portion of the incision and secured with 3-0 Prolene.  The wounds were cleansed.   At the end,  the nipple areolar complexes were examined, showing excellent blood supply.  Steri-Strips and sterile dressing applied to all areas.  ABDs, Hypafix tape and garment.  ESTIMATED BLOOD LOSS:  100.  COMPLICATIONS:  None.    DISPOSITION:  :  After proper dressing had been placed, the patient was taken to the recovery room in excellent condition.  AN/NUANCE  D:01/28/2018 T:01/28/2018 JOB:[id number]

## 2018-01-28 NOTE — Anesthesia Preprocedure Evaluation (Signed)
Anesthesia Evaluation  Patient identified by MRN, date of birth, ID band Patient awake    Reviewed: Allergy & Precautions, NPO status , Patient's Chart, lab work & pertinent test results  History of Anesthesia Complications Negative for: history of anesthetic complications  Airway Mallampati: III  TM Distance: >3 FB Neck ROM: Full    Dental  (+) Upper Dentures, Lower Dentures   Pulmonary sleep apnea ,    breath sounds clear to auscultation       Cardiovascular hypertension, Pt. on medications (-) angina(-) Past MI and (-) CHF (-) dysrhythmias  Rhythm:Regular     Neuro/Psych PSYCHIATRIC DISORDERS Anxiety Depression negative neurological ROS     GI/Hepatic negative GI ROS, Neg liver ROS,   Endo/Other    Renal/GU negative Renal ROS     Musculoskeletal negative musculoskeletal ROS (+)   Abdominal   Peds  Hematology negative hematology ROS (+)   Anesthesia Other Findings   Reproductive/Obstetrics                             Anesthesia Physical Anesthesia Plan  ASA: II  Anesthesia Plan: General   Post-op Pain Management:    Induction: Intravenous  PONV Risk Score and Plan: 3 and Dexamethasone and Ondansetron  Airway Management Planned: LMA and Oral ETT  Additional Equipment: None  Intra-op Plan:   Post-operative Plan: Extubation in OR  Informed Consent: I have reviewed the patients History and Physical, chart, labs and discussed the procedure including the risks, benefits and alternatives for the proposed anesthesia with the patient or authorized representative who has indicated his/her understanding and acceptance.   Dental advisory given  Plan Discussed with: CRNA and Surgeon  Anesthesia Plan Comments:         Anesthesia Quick Evaluation

## 2018-01-28 NOTE — Anesthesia Procedure Notes (Signed)
Procedure Name: Intubation Date/Time: 01/28/2018 7:50 AM Performed by: Lyndee Leo, CRNA Pre-anesthesia Checklist: Patient identified, Emergency Drugs available, Suction available and Patient being monitored Patient Re-evaluated:Patient Re-evaluated prior to induction Oxygen Delivery Method: Circle system utilized Preoxygenation: Pre-oxygenation with 100% oxygen Induction Type: IV induction Ventilation: Mask ventilation without difficulty Laryngoscope Size: Mac and 3 Grade View: Grade I Tube type: Oral Tube size: 7.0 mm Number of attempts: 1 Airway Equipment and Method: Stylet and Oral airway Placement Confirmation: ETT inserted through vocal cords under direct vision,  positive ETCO2 and breath sounds checked- equal and bilateral Secured at: 20 cm Tube secured with: Tape Dental Injury: Teeth and Oropharynx as per pre-operative assessment

## 2018-01-28 NOTE — Transfer of Care (Signed)
Immediate Anesthesia Transfer of Care Note  Patient: Jeanette [name]  Procedure(s) Performed: MAMMARY REDUCTION  (BREAST) (Bilateral Breast)  Patient Location: PACU  Anesthesia Type:General  Level of Consciousness: awake, sedated and responds to stimulation  Airway & Oxygen Therapy: Patient Spontanous Breathing and Patient connected to face mask oxygen  Post-op Assessment: Report given to RN and Post -op Vital signs reviewed and stable  Post vital signs: Reviewed and stable  Last Vitals:  Vitals Value Taken Time  BP 149/80 01/28/2018 10:36 AM  Temp    Pulse 91 01/28/2018 10:39 AM  Resp 19 01/28/2018 10:39 AM  SpO2 96 % 01/28/2018 10:39 AM  Vitals shown include unvalidated device data.  Last Pain:  Vitals:   01/28/18 0703  TempSrc: Oral  PainSc: 0-No pain         Complications: No apparent anesthesia complications

## 2018-01-28 NOTE — Anesthesia Postprocedure Evaluation (Signed)
Anesthesia Post Note  Patient: Jeanette [name]  Procedure(s) Performed: MAMMARY REDUCTION  (BREAST) (Bilateral Breast)     Patient location during evaluation: PACU Anesthesia Type: General Level of consciousness: awake and alert Pain management: pain level controlled Vital Signs Assessment: post-procedure vital signs reviewed and stable Respiratory status: spontaneous breathing, nonlabored ventilation, respiratory function stable and patient connected to nasal cannula oxygen Cardiovascular status: blood pressure returned to baseline and stable Postop Assessment: no apparent nausea or vomiting Anesthetic complications: no    Last Vitals:  Vitals:   01/28/18 1130 01/28/18 1214  BP: (!) 158/94 (!) 157/88  Pulse: 95 98  Resp: 13 20  Temp:  36.8 C  SpO2: 97% 97%    Last Pain:  Vitals:   01/28/18 1214  TempSrc:   PainSc: 7                  Zaylin Runco S

## 2018-01-28 NOTE — Discharge Instructions (Signed)
Oxycodone given at 1209 01/28/2018, next dose due at 6pm.   About my Jackson-Pratt Bulb Drain  What is a Jackson-Pratt bulb? A Jackson-Pratt is a soft, round device used to collect drainage. It is connected to a long, thin drainage catheter, which is held in place by one or two small stiches near your surgical incision site. When the bulb is squeezed, it forms a vacuum, forcing the drainage to empty into the bulb.  Emptying the Jackson-Pratt bulb- To empty the bulb: 1. Release the plug on the top of the bulb. 2. Pour the bulb's contents into a measuring container which your nurse will provide. 3. Record the time emptied and amount of drainage. Empty the drain(s) as often as your     doctor or nurse recommends.  Date                  Time                    Amount (Drain 1)                 Amount (Drain 2)  _____________________________________________________________________  _____________________________________________________________________  _____________________________________________________________________  _____________________________________________________________________  _____________________________________________________________________  _____________________________________________________________________  _____________________________________________________________________  _____________________________________________________________________  Squeezing the Jackson-Pratt Bulb- To squeeze the bulb: 1. Make sure the plug at the top of the bulb is open. 2. Squeeze the bulb tightly in your fist. You will hear air squeezing from the bulb. 3. Replace the plug while the bulb is squeezed. 4. Use a safety pin to attach the bulb to your clothing. This will keep the catheter from     pulling at the bulb insertion site.  When to call your doctor- Call your doctor if:  Drain site becomes red, swollen or hot.  You have a fever greater than 101 degrees F.  There is  oozing at the drain site.  Drain falls out (apply a guaze bandage over the drain hole and secure it with tape).  Drainage increases daily not related to activity patterns. (You will usually have more drainage when you are active than when you are resting.)  Drainage has a bad odor.  Breast Reduction Care After Refer to this sheet in the next few weeks. These instructions provide you with information on caring for yourself after your procedure. Your caregiver may also give you more specific instructions. Your treatment has been planned according to current medical practices, but problems sometimes occur. Call your caregiver if you have any problems or questions after your procedure. HOME CARE INSTRUCTIONS  Do not lift more than 5 pounds with one arm, or 10 pounds with both arms, for 1 month.   Do not sleep on your stomach for 4 to 6 weeks.   Do not do vigorous exercise such as bouncing, aerobics, or jumping for 6 weeks. Walking is not restricted.   Do not drive while you are taking prescription pain medicine.   Avoid prolonged sun exposure.   Keep dressings dry and clean  Measure jp drainage every 12 hrs and measure   You may slowly go back to your normal diet. Start with a light meal and increase as comfortable.   You may shower 24 hours after your drains are removed unless instructed differently by your caregiver.   Take your pain medicine as prescribed. Discomfort is normal after breast reduction surgery.   Keep the head of your bed elevated 40 degrees   : Call the office if you notice:  You have a fever.  You notice drainage from the incision that smells bad.   You have persistent pain.   You have persistent bleeding from the incision or nipple discharge.   You develop increased swelling or swelling that is greater in one breast than in the other.  MAKE SURE YOU:   Understand these instructions.   Will watch your condition.   Will get help right away if you  are not doing well or get worse.  Document Released: 08/17/2003 Document Revised: 09/14/2010 Document Reviewed: 03/28/2007 [facility] Patient Information 2012 [place]. Post Anesthesia Home Care Instructions  Activity: Get plenty of rest for the remainder of the day. A responsible individual must stay with you for 24 hours following the procedure.  For the next 24 hours, DO NOT: -Drive a car -Paediatric nurse -Drink alcoholic beverages -Take any medication unless instructed by your physician -Make any legal decisions or sign important papers.  Meals: Start with liquid foods such as gelatin or soup. Progress to regular foods as tolerated. Avoid greasy, spicy, heavy foods. If nausea and/or vomiting occur, drink only clear liquids until the nausea and/or vomiting subsides. Call your physician if vomiting continues.  Special Instructions/Symptoms: Your throat may feel dry or sore from the anesthesia or the breathing tube placed in your throat during surgery. If this causes discomfort, gargle with warm salt water. The discomfort should disappear within 24 hours.  If you had a scopolamine patch placed behind your ear for the management of post- operative nausea and/or vomiting:  1. The medication in the patch is effective for 72 hours, after which it should be removed.  Wrap patch in a tissue and discard in the trash. Wash hands thoroughly with soap and water. 2. You may remove the patch earlier than 72 hours if you experience unpleasant side effects which may include dry mouth, dizziness or visual disturbances. 3. Avoid touching the patch. Wash your hands with soap and water after contact with the patch.

## 2018-01-29 ENCOUNTER — Encounter (HOSPITAL_BASED_OUTPATIENT_CLINIC_OR_DEPARTMENT_OTHER): Payer: Self-pay | Admitting: Specialist

## 2018-02-01 ENCOUNTER — Encounter: Payer: Self-pay | Admitting: Internal Medicine

## 2018-03-06 ENCOUNTER — Ambulatory Visit: Payer: Medicare HMO

## 2018-03-11 ENCOUNTER — Encounter: Payer: Medicare HMO | Admitting: Internal Medicine

## 2018-04-10 ENCOUNTER — Ambulatory Visit (INDEPENDENT_AMBULATORY_CARE_PROVIDER_SITE_OTHER): Payer: Medicare HMO | Admitting: Internal Medicine

## 2018-04-10 ENCOUNTER — Other Ambulatory Visit: Payer: Self-pay

## 2018-04-10 ENCOUNTER — Encounter: Payer: Self-pay | Admitting: Internal Medicine

## 2018-04-10 VITALS — BP 113/73 | Ht 66.0 in

## 2018-04-10 DIAGNOSIS — N3000 Acute cystitis without hematuria: Secondary | ICD-10-CM

## 2018-04-10 DIAGNOSIS — I10 Essential (primary) hypertension: Secondary | ICD-10-CM

## 2018-04-10 MED ORDER — SULFAMETHOXAZOLE-TRIMETHOPRIM 800-160 MG PO TABS: 1.0000 | ORAL_TABLET | Freq: Two times a day (BID) | ORAL | 0 refills | Status: AC

## 2018-04-10 NOTE — Progress Notes (Signed)
Date:  04/10/2018   Name:  Jeanette [name] [name]   DOB:  09-02-1950   MRN:  [id number]   Chief Complaint: Urinary Tract Infection This encounter was conducted via telephone due to the [place] outbreak.  The patient, Jeanette [name], [name], was identified by her DOB and she agreed to proceed.  She was advised that telephone interaction can not completely take the place of face to face visits. She expressed her understanding but was grateful to get care without going out. She was contacted by phone at 3:14 PM.  Urinary Tract Infection   This is a new problem. The current episode started in the past 7 days. The problem occurs every urination. The problem has been unchanged. The patient is experiencing no pain. There has been no fever. Associated symptoms include frequency and urgency. Pertinent negatives include no chills, hematuria, nausea or vomiting. Treatments tried: AZO. The treatment provided no relief.  Hypertension  The problem has been resolved (per patient.  She stopped Micardis and has been monitoring her blood pressures.) since onset. The problem is controlled. Pertinent negatives include no chest pain or shortness of breath.    Review of Systems  Constitutional: Negative for chills.  Respiratory: Negative for shortness of breath.   Cardiovascular: Negative for chest pain.  Gastrointestinal: Negative for abdominal pain, nausea and vomiting.  Genitourinary: Positive for frequency and urgency. Negative for hematuria.    Patient Active Problem List   Diagnosis Date Noted  . Chronic midline thoracic back pain 09/06/2017  . Diarrhea due to malabsorption 06/20/2017  . Essential hypertension 06/20/2017  . Special screening for malignant neoplasms, colon   . Benign neoplasm of descending colon   . Vitamin D deficiency 07/25/2016  . Depression 07/24/2016  . History of gastric bypass 07/24/2016  . Gait instability 07/24/2016  . Urticaria 07/24/2016  . External hemorrhoids 07/24/2016  .  Obesity (BMI 30-39.9) 07/24/2016    No Known Allergies  Past Surgical History:  Procedure Laterality Date  . ABDOMINAL HYSTERECTOMY    . APPENDECTOMY    . BREAST REDUCTION SURGERY Bilateral 01/28/2018   Procedure: MAMMARY REDUCTION  (BREAST);  Surgeon: Cristine Polio, MD;  Location: [facility];  Service: Plastics;  Laterality: Bilateral;  . CHOLECYSTECTOMY    . COLONOSCOPY WITH PROPOFOL N/A 09/04/2016   Procedure: COLONOSCOPY WITH PROPOFOL;  Surgeon: Lucilla Lame, MD;  Location: [facility];  Service: Gastroenterology;  Laterality: N/A;  requests early  . GASTRIC BYPASS  06/20/2011  . HERNIA REPAIR    . POLYPECTOMY  09/04/2016   Procedure: POLYPECTOMY;  Surgeon: Lucilla Lame, MD;  Location: [place];  Service: Gastroenterology;;    Social History   Tobacco Use  . Smoking status: Never Smoker  . Smokeless tobacco: Never Used  . Tobacco comment: smoking cessation materials not required  Substance Use Topics  . Alcohol use: No  . Drug use: No     Medication list has been reviewed and updated.  No outpatient medications have been marked as taking for the 04/10/18 encounter (Office Visit) with Glean Hess, MD.    [facility] 2/9 Scores 04/10/2018 06/20/2017 08/25/2016  PHQ - 2 Score 0 3 0  PHQ- 9 Score - 8 0    Physical Exam No exam was done. Patient alert and oriented in no apparent distress.  Wt Readings from Last 3 Encounters:  01/28/18 242 lb 15.2 oz (110.2 kg)  09/06/17 244 lb (110.7 kg)  06/20/17 237 lb (107.5 kg)    BP  113/73 Comment: patient reported  Ht 5\' 6"  (1.676 m)   BMI 39.21 kg/m   Assessment and Plan: 1. Acute cystitis without hematuria Continue to increase fluids Call or be seen if sx worsen - sulfamethoxazole-trimethoprim (BACTRIM DS,SEPTRA DS) 800-160 MG tablet; Take 1 tablet by mouth 2 (two) times daily for 5 days.  Dispense: 10 tablet; Refill: 0  2. Essential hypertension Continue to monitor off of medication.    Partially dictated using Editor, commissioning. Any errors are unintentional.  I spent 8 minutes on this telephone encounter.  Halina Maidens, MD [facility]  04/10/2018

## 2018-06-20 DIAGNOSIS — M5416 Radiculopathy, lumbar region: Secondary | ICD-10-CM | POA: Diagnosis not present

## 2018-06-20 DIAGNOSIS — M545 Low back pain: Secondary | ICD-10-CM | POA: Diagnosis not present

## 2018-06-20 DIAGNOSIS — M4306 Spondylolysis, lumbar region: Secondary | ICD-10-CM | POA: Diagnosis not present

## 2018-06-28 DIAGNOSIS — M5416 Radiculopathy, lumbar region: Secondary | ICD-10-CM | POA: Diagnosis not present

## 2018-07-09 DIAGNOSIS — M5416 Radiculopathy, lumbar region: Secondary | ICD-10-CM | POA: Diagnosis not present

## 2018-07-09 DIAGNOSIS — M545 Low back pain: Secondary | ICD-10-CM | POA: Diagnosis not present

## 2018-07-09 DIAGNOSIS — R8569 Abnormal cytological findings in specimens from other digestive organs and abdominal cavity: Secondary | ICD-10-CM | POA: Diagnosis not present

## 2018-07-09 DIAGNOSIS — M4306 Spondylolysis, lumbar region: Secondary | ICD-10-CM | POA: Diagnosis not present

## 2018-07-10 ENCOUNTER — Ambulatory Visit: Payer: Medicare HMO | Admitting: Internal Medicine

## 2018-07-12 ENCOUNTER — Encounter: Payer: Self-pay | Admitting: Internal Medicine

## 2018-07-12 ENCOUNTER — Other Ambulatory Visit: Payer: Self-pay

## 2018-07-12 ENCOUNTER — Other Ambulatory Visit
Admission: RE | Admit: 2018-07-12 | Discharge: 2018-07-12 | Disposition: A | Discharge status: Home / Self Care | Payer: Medicare HMO | Attending: Internal Medicine | Admitting: Internal Medicine

## 2018-07-12 ENCOUNTER — Ambulatory Visit (INDEPENDENT_AMBULATORY_CARE_PROVIDER_SITE_OTHER): Payer: Medicare HMO | Admitting: Internal Medicine

## 2018-07-12 VITALS — BP 132/78 | HR 86 | Ht 66.0 in | Wt 210.0 lb

## 2018-07-12 DIAGNOSIS — N1330 Unspecified hydronephrosis: Secondary | ICD-10-CM

## 2018-07-12 DIAGNOSIS — K838 Other specified diseases of biliary tract: Secondary | ICD-10-CM

## 2018-07-12 DIAGNOSIS — N3000 Acute cystitis without hematuria: Secondary | ICD-10-CM

## 2018-07-12 DIAGNOSIS — D3011 Benign neoplasm of right renal pelvis: Secondary | ICD-10-CM | POA: Diagnosis not present

## 2018-07-12 DIAGNOSIS — E611 Iron deficiency: Secondary | ICD-10-CM | POA: Insufficient documentation

## 2018-07-12 DIAGNOSIS — I10 Essential (primary) hypertension: Secondary | ICD-10-CM | POA: Diagnosis not present

## 2018-07-12 DIAGNOSIS — K429 Umbilical hernia without obstruction or gangrene: Secondary | ICD-10-CM | POA: Insufficient documentation

## 2018-07-12 LAB — POCT URINALYSIS DIPSTICK
Bilirubin, UA: NEGATIVE
Glucose, UA: NEGATIVE
Leukocytes, UA: NEGATIVE
Nitrite, UA: NEGATIVE
Protein, UA: POSITIVE — AB
Spec Grav, UA: >=1.03 — AB (ref 1.010–1.025)
Urobilinogen, UA: 0.2 E.U./dL
pH, UA: 6 (ref 5.0–8.0)

## 2018-07-12 LAB — COMPREHENSIVE METABOLIC PANEL
ALT: 10 U/L (ref 0–44)
AST: 19 U/L (ref 15–41)
Albumin: 3.5 g/dL (ref 3.5–5.0)
Alkaline Phosphatase: 81 U/L (ref 38–126)
Anion gap: 10 (ref 5–15)
BUN: 21 mg/dL (ref 8–23)
CO2: 24 mmol/L (ref 22–32)
Calcium: 8.7 mg/dL — ABNORMAL LOW (ref 8.9–10.3)
Chloride: 102 mmol/L (ref 98–111)
Creatinine, Ser: 0.92 mg/dL (ref 0.44–1.00)
GFR calc Af Amer: >60 mL/min (ref >60)
GFR calc non Af Amer: >60 mL/min (ref >60)
Glucose, Bld: 119 mg/dL — ABNORMAL HIGH (ref 70–99)
Potassium: 3.4 mmol/L — ABNORMAL LOW (ref 3.5–5.1)
Sodium: 136 mmol/L (ref 135–145)
Total Bilirubin: 0.6 mg/dL (ref 0.3–1.2)
Total Protein: 6.9 g/dL (ref 6.5–8.1)

## 2018-07-12 LAB — CBC WITH DIFFERENTIAL/PLATELET
Abs Immature Granulocytes: 0.01 10*3/uL (ref 0.00–0.07)
Basophils Absolute: 0.1 10*3/uL (ref 0.0–0.1)
Basophils Relative: 1 %
Eosinophils Absolute: 0.1 10*3/uL (ref 0.0–0.5)
Eosinophils Relative: 2 %
HCT: 39.3 % (ref 36.0–46.0)
Hemoglobin: 12.9 g/dL (ref 12.0–15.0)
Immature Granulocytes: 0 %
Lymphocytes Relative: 24 %
Lymphs Abs: 1.3 10*3/uL (ref 0.7–4.0)
MCH: 30.4 pg (ref 26.0–34.0)
MCHC: 32.8 g/dL (ref 30.0–36.0)
MCV: 92.7 fL (ref 80.0–100.0)
Monocytes Absolute: 0.3 10*3/uL (ref 0.1–1.0)
Monocytes Relative: 5 %
Neutro Abs: 3.8 10*3/uL (ref 1.7–7.7)
Neutrophils Relative %: 68 %
Platelets: 381 10*3/uL (ref 150–400)
RBC: 4.24 MIL/uL (ref 3.87–5.11)
RDW: 13.9 % (ref 11.5–15.5)
WBC: 5.5 10*3/uL (ref 4.0–10.5)
nRBC: 0 % (ref 0.0–0.2)

## 2018-07-12 MED ORDER — NITROFURANTOIN MONOHYD MACRO 100 MG PO CAPS: 100.0000 mg | ORAL_CAPSULE | Freq: Two times a day (BID) | ORAL | 0 refills | Status: AC

## 2018-07-12 MED ORDER — TELMISARTAN 20 MG PO TABS: 20.0000 mg | ORAL_TABLET | Freq: Every day | ORAL | 5 refills | Status: DC

## 2018-07-12 NOTE — Progress Notes (Signed)
Date:  07/12/2018   Name:  Jeanette [name]   DOB:  1950-02-16   MRN:  [id number]   Chief Complaint: MRI (Seen Dr. Harlow Mares for ortho- for back pain. They did a MRI. They found she has a enlarge liver, and cyst on Rt kidney lower pole. Renal cystic legions.) and Hypertension (Patient said she went back on telmisartan 20 mg 2 days ago because her BP was high again. )  Hypertension This is a chronic problem. The problem is controlled. Pertinent negatives include no chest pain, headaches, palpitations or shortness of breath. Past treatments include angiotensin blockers. The current treatment provides significant improvement.   Abnormal findings of left kidney and bile duct dilatation on MRI done to evaluate the lumbar spine. Left hydronephrosis with transition point at left UPJ Right parapelvic cluster of cystic foci Marked dilatation of the extrahepatic and intrahepatic bile ducts  She is having low back pain which was the reason for the MRI.  She is being referred to PTx.   Review of Systems  Constitutional: Negative for chills, fatigue, fever and unexpected weight change.  Respiratory: Negative for shortness of breath and wheezing.   Cardiovascular: Negative for chest pain, palpitations and leg swelling.  Gastrointestinal: Positive for diarrhea (chronic since gall bladder surgery). Negative for abdominal pain, blood in stool and constipation.  Genitourinary: Positive for frequency and urgency. Negative for dysuria and hematuria.  Musculoskeletal: Positive for back pain.  Skin: Negative for color change.  Allergic/Immunologic: Negative for environmental allergies.  Neurological: Negative for dizziness and headaches.  Psychiatric/Behavioral: Negative for dysphoric mood and sleep disturbance.    Patient Active Problem List   Diagnosis Date Noted  . Umbilical hernia 44/01/270  . Iron deficiency 07/12/2018  . Chronic midline thoracic back pain 09/06/2017  . Lumbar radiculopathy  08/07/2017  . Diarrhea due to malabsorption 06/20/2017  . Essential hypertension 06/20/2017  . Special screening for malignant neoplasms, colon   . Benign neoplasm of descending colon   . Vitamin D deficiency 07/25/2016  . History of gastric bypass 07/24/2016  . Gait instability 07/24/2016  . Urticaria 07/24/2016  . External hemorrhoids 07/24/2016  . Obesity (BMI 30-39.9) 07/24/2016    No Known Allergies  Past Surgical History:  Procedure Laterality Date  . ABDOMINAL HYSTERECTOMY    . APPENDECTOMY    . BREAST REDUCTION SURGERY Bilateral 01/28/2018   Procedure: MAMMARY REDUCTION  (BREAST);  Surgeon: Cristine Polio, MD;  Location: [facility];  Service: Plastics;  Laterality: Bilateral;  . CHOLECYSTECTOMY    . COLONOSCOPY WITH PROPOFOL N/A 09/04/2016   Procedure: COLONOSCOPY WITH PROPOFOL;  Surgeon: Lucilla Lame, MD;  Location: [facility];  Service: Gastroenterology;  Laterality: N/A;  requests early  . GASTRIC BYPASS  06/20/2011  . HERNIA REPAIR    . POLYPECTOMY  09/04/2016   Procedure: POLYPECTOMY;  Surgeon: Lucilla Lame, MD;  Location: [place];  Service: Gastroenterology;;    Social History   Tobacco Use  . Smoking status: Never Smoker  . Smokeless tobacco: Never Used  . Tobacco comment: smoking cessation materials not required  Substance Use Topics  . Alcohol use: No  . Drug use: No     Medication list has been reviewed and updated.  Current Meds  Medication Sig  . telmisartan (MICARDIS) 20 MG tablet Take 20 mg by mouth daily.    PHQ 2/9 Scores 07/12/2018 04/10/2018 06/20/2017 08/25/2016  PHQ - 2 Score 0 0 3 0  PHQ- 9 Score - - 8  0    BP Readings from Last 3 Encounters:  07/12/18 132/78  04/10/18 113/73  01/28/18 (!) 157/88    Physical Exam Vitals signs and nursing note reviewed.  Constitutional:      General: She is not in acute distress.    Appearance: She is well-developed.  HENT:     Head: Normocephalic and  atraumatic.  Eyes:     General: No scleral icterus.    Conjunctiva/sclera: Conjunctivae normal.  Neck:     Vascular: No carotid bruit.  Cardiovascular:     Rate and Rhythm: Normal rate and regular rhythm.     Pulses: Normal pulses.     Heart sounds: No murmur.  Pulmonary:     Effort: Pulmonary effort is normal. No respiratory distress.     Breath sounds: No wheezing or rhonchi.  Abdominal:     General: There is no distension.     Palpations: Abdomen is soft. There is no mass.     Tenderness: There is no abdominal tenderness. There is no right CVA tenderness or left CVA tenderness.  Musculoskeletal:     Right lower leg: No edema.     Left lower leg: No edema.  Lymphadenopathy:     Cervical: No cervical adenopathy.  Skin:    General: Skin is warm and dry.     Findings: No rash.  Neurological:     Mental Status: She is alert and oriented to person, place, and time.  Psychiatric:        Attention and Perception: Attention normal.        Mood and Affect: Mood normal.        Behavior: Behavior normal.        Thought Content: Thought content normal.     Wt Readings from Last 3 Encounters:  07/12/18 210 lb (95.3 kg)  01/28/18 242 lb 15.2 oz (110.2 kg)  09/06/17 244 lb (110.7 kg)    BP 132/78   Pulse 86   Ht 5\' 6"  (1.676 m)   Wt 210 lb (95.3 kg)   SpO2 94%   BMI 33.89 kg/m   Assessment and Plan: 1. Hydronephrosis, unspecified hydronephrosis type Pt feels that she is unable to undergo CTA or MRA due to tight enclosure so will get abdominal US first - US Abdomen Complete; Future  2. Intrahepatic bile duct dilation Have reached out to Dr. Darnell Level who performed her gastric bypass to see if dilated hepatic ducts are a common finding after surgery Check labs to evaluate hepatic function and [place] to further characterize - Comprehensive metabolic panel - CBC with Differential/Platelet - US Abdomen Complete; Future  3. Benign neoplasm of right renal pelvis - US Abdomen  Complete; Future  4. Acute cystitis without hematuria Urine appears infected but with minimal blood- POCT urinalysis dipstick Urine dipstick shows positive for RBC's, positive for protein and positive for nitrates.  Micro exam: 30 WBC's per HPF, 5 RBC's per HPF, 4+ bacteria, 0 crystals seen and 0 casts seen.  Continue daily fluid intake - nitrofurantoin, macrocrystal-monohydrate, (MACROBID) 100 MG capsule; Take 1 capsule (100 mg total) by mouth 2 (two) times daily for 7 days.  Dispense: 14 capsule; Refill: 0  5. Essential hypertension Continue micardis daily for BP control - telmisartan (MICARDIS) 20 MG tablet; Take 1 tablet (20 mg total) by mouth daily.  Dispense: 30 tablet; Refill: 5   Partially dictated using Editor, commissioning. Any errors are unintentional.  Halina Maidens, MD [facility]  07/12/2018      

## 2018-07-17 ENCOUNTER — Ambulatory Visit
Admission: RE | Admit: 2018-07-17 | Discharge: 2018-07-17 | Disposition: A | Discharge status: Home / Self Care | Payer: Medicare HMO | Source: Ambulatory Visit | Attending: Internal Medicine | Admitting: Internal Medicine

## 2018-07-17 ENCOUNTER — Other Ambulatory Visit: Payer: Self-pay

## 2018-07-17 DIAGNOSIS — K838 Other specified diseases of biliary tract: Secondary | ICD-10-CM | POA: Diagnosis not present

## 2018-07-17 DIAGNOSIS — D3011 Benign neoplasm of right renal pelvis: Secondary | ICD-10-CM | POA: Insufficient documentation

## 2018-07-17 DIAGNOSIS — N281 Cyst of kidney, acquired: Secondary | ICD-10-CM | POA: Diagnosis not present

## 2018-07-17 DIAGNOSIS — N1330 Unspecified hydronephrosis: Secondary | ICD-10-CM | POA: Diagnosis not present

## 2018-07-18 NOTE — Progress Notes (Signed)
She wants to know if when you talk to Dr. Darnell Level can you ask if her bile duct could be dilated because she does not have a gall bladder.  Also, she had a sever reaction to Macrobid after taking it for just one day. She had severe vomit, sever stomach pains, nausea, and diarrhea. She said one she stopped it it took her a while to recover and she almost had to call 911 at one point. She wants to know if you want her to take a different abx for acute cystitis and hematuria?

## 2018-07-22 ENCOUNTER — Telehealth: Payer: Self-pay

## 2018-07-22 NOTE — Telephone Encounter (Signed)
Dr. Darnell Level' nurse called and left VM stating he needs Dr. Gaspar Cola cell phone number or a number he can reach her at to discuss this patient. He is in the OR very often, and is hard to get in touch with. Tried calling the office. They do not have a nurse line stated when calling. Sent on hold for 10 mins, then left VM informing our back door line number for Dr. Darnell Level to call to discuss patient. Awaiting call back about this.

## 2018-07-26 ENCOUNTER — Telehealth: Payer: Self-pay | Admitting: Internal Medicine

## 2018-07-26 NOTE — Telephone Encounter (Signed)
Please call patient and let her know that I spoke to Dr. Darnell Level.  The changes to her bile ducts are due to her cholecystectomy.  They are normal and expected and do not need further follow up. Since her kidneys are normal on [place] and all the blood work was normal, there is nothing that needs to be done at this time with respect to the MRI report.

## 2018-07-26 NOTE — Telephone Encounter (Signed)
Called and spoke with patient. Informed of this. She verbalized understanding.

## 2018-08-08 ENCOUNTER — Ambulatory Visit: Payer: Medicare HMO | Admitting: Gastroenterology

## 2018-10-14 DIAGNOSIS — N62 Hypertrophy of breast: Secondary | ICD-10-CM | POA: Diagnosis not present

## 2018-10-14 DIAGNOSIS — M542 Cervicalgia: Secondary | ICD-10-CM | POA: Diagnosis not present

## 2018-10-14 DIAGNOSIS — M546 Pain in thoracic spine: Secondary | ICD-10-CM | POA: Diagnosis not present

## 2018-11-04 ENCOUNTER — Ambulatory Visit (INDEPENDENT_AMBULATORY_CARE_PROVIDER_SITE_OTHER): Payer: Medicare HMO

## 2018-11-04 ENCOUNTER — Other Ambulatory Visit: Payer: Self-pay

## 2018-11-04 DIAGNOSIS — Z23 Encounter for immunization: Secondary | ICD-10-CM

## 2018-11-04 NOTE — Addendum Note (Signed)
Addended by: Clista Bernhardt on: 11/04/2018 03:00 PM   Modules accepted: Orders

## 2019-01-27 ENCOUNTER — Other Ambulatory Visit: Payer: Self-pay | Admitting: Internal Medicine

## 2019-01-27 DIAGNOSIS — I10 Essential (primary) hypertension: Secondary | ICD-10-CM

## 2019-01-29 NOTE — Telephone Encounter (Signed)
Left message

## 2019-03-15 DIAGNOSIS — Z0100 Encounter for examination of eyes and vision without abnormal findings: Secondary | ICD-10-CM | POA: Diagnosis not present

## 2019-03-15 DIAGNOSIS — H524 Presbyopia: Secondary | ICD-10-CM | POA: Diagnosis not present

## 2019-05-06 ENCOUNTER — Other Ambulatory Visit: Payer: Self-pay | Admitting: Internal Medicine

## 2019-05-08 ENCOUNTER — Other Ambulatory Visit: Payer: Self-pay

## 2019-05-08 ENCOUNTER — Encounter: Payer: Self-pay | Admitting: Internal Medicine

## 2019-05-08 ENCOUNTER — Ambulatory Visit (INDEPENDENT_AMBULATORY_CARE_PROVIDER_SITE_OTHER): Payer: Medicare HMO | Admitting: Internal Medicine

## 2019-05-08 ENCOUNTER — Other Ambulatory Visit
Admission: RE | Admit: 2019-05-08 | Discharge: 2019-05-08 | Disposition: A | Discharge status: Home / Self Care | Payer: Medicare HMO | Attending: Internal Medicine | Admitting: Internal Medicine

## 2019-05-08 VITALS — BP 144/88 | HR 88 | Temp 97.3°F | Ht 66.0 in | Wt 228.0 lb

## 2019-05-08 DIAGNOSIS — I10 Essential (primary) hypertension: Secondary | ICD-10-CM | POA: Insufficient documentation

## 2019-05-08 DIAGNOSIS — E611 Iron deficiency: Secondary | ICD-10-CM

## 2019-05-08 DIAGNOSIS — K909 Intestinal malabsorption, unspecified: Secondary | ICD-10-CM

## 2019-05-08 DIAGNOSIS — R197 Diarrhea, unspecified: Secondary | ICD-10-CM | POA: Insufficient documentation

## 2019-05-08 DIAGNOSIS — E559 Vitamin D deficiency, unspecified: Secondary | ICD-10-CM | POA: Insufficient documentation

## 2019-05-08 DIAGNOSIS — J3089 Other allergic rhinitis: Secondary | ICD-10-CM

## 2019-05-08 LAB — CBC WITH DIFFERENTIAL/PLATELET
Abs Immature Granulocytes: 0.01 10*3/uL (ref 0.00–0.07)
Basophils Absolute: 0 10*3/uL (ref 0.0–0.1)
Basophils Relative: 1 %
Eosinophils Absolute: 0.2 10*3/uL (ref 0.0–0.5)
Eosinophils Relative: 4 %
HCT: 38.2 % (ref 36.0–46.0)
Hemoglobin: 12.5 g/dL (ref 12.0–15.0)
Immature Granulocytes: 0 %
Lymphocytes Relative: 27 %
Lymphs Abs: 1.2 10*3/uL (ref 0.7–4.0)
MCH: 29.9 pg (ref 26.0–34.0)
MCHC: 32.7 g/dL (ref 30.0–36.0)
MCV: 91.4 fL (ref 80.0–100.0)
Monocytes Absolute: 0.3 10*3/uL (ref 0.1–1.0)
Monocytes Relative: 6 %
Neutro Abs: 2.8 10*3/uL (ref 1.7–7.7)
Neutrophils Relative %: 62 %
Platelets: 296 10*3/uL (ref 150–400)
RBC: 4.18 MIL/uL (ref 3.87–5.11)
RDW: 13.3 % (ref 11.5–15.5)
WBC: 4.5 10*3/uL (ref 4.0–10.5)
nRBC: 0 % (ref 0.0–0.2)

## 2019-05-08 LAB — COMPREHENSIVE METABOLIC PANEL
ALT: 14 U/L (ref 0–44)
AST: 19 U/L (ref 15–41)
Albumin: 3.5 g/dL (ref 3.5–5.0)
Alkaline Phosphatase: 70 U/L (ref 38–126)
Anion gap: 8 (ref 5–15)
BUN: 29 mg/dL — ABNORMAL HIGH (ref 8–23)
CO2: 26 mmol/L (ref 22–32)
Calcium: 8.5 mg/dL — ABNORMAL LOW (ref 8.9–10.3)
Chloride: 104 mmol/L (ref 98–111)
Creatinine, Ser: 0.76 mg/dL (ref 0.44–1.00)
GFR calc Af Amer: >60 mL/min (ref >60)
GFR calc non Af Amer: >60 mL/min (ref >60)
Glucose, Bld: 106 mg/dL — ABNORMAL HIGH (ref 70–99)
Potassium: 3.7 mmol/L (ref 3.5–5.1)
Sodium: 138 mmol/L (ref 135–145)
Total Bilirubin: 0.6 mg/dL (ref 0.3–1.2)
Total Protein: 7 g/dL (ref 6.5–8.1)

## 2019-05-08 LAB — LIPID PANEL
Cholesterol: 217 mg/dL — ABNORMAL HIGH (ref 0–200)
HDL: 82 mg/dL (ref >40)
LDL Cholesterol: 122 mg/dL — ABNORMAL HIGH (ref 0–99)
Total CHOL/HDL Ratio: 2.6 RATIO
Triglycerides: 66 mg/dL (ref <150)
VLDL: 13 mg/dL (ref 0–40)

## 2019-05-08 LAB — VITAMIN D 25 HYDROXY (VIT D DEFICIENCY, FRACTURES): Vit D, 25-Hydroxy: 39.02 ng/mL (ref 30–100)

## 2019-05-08 LAB — TSH: TSH: 3.307 u[IU]/mL (ref 0.350–4.500)

## 2019-05-08 LAB — T4, FREE: Free T4: 0.87 ng/dL (ref 0.61–1.12)

## 2019-05-08 MED ORDER — TELMISARTAN 20 MG PO TABS: 20.0000 mg | ORAL_TABLET | Freq: Every day | ORAL | 1 refills | Status: DC

## 2019-05-08 NOTE — Progress Notes (Signed)
Date:  05/08/2019   Name:  Jeanette [name]   DOB:  11/18/1950   MRN:  [id number]   Chief Complaint: Hypertension (Follow up. Set outside all day Monday and said she was wheezing- may be allergies? Feels SOB and may be related to allergies. Having no sick symptoms. )  Hypertension This is a chronic problem. The problem is uncontrolled (ran out of meds last week due to medication recall). Pertinent negatives include no chest pain, headaches, orthopnea, palpitations, peripheral edema, shortness of breath or sweats. Past treatments include angiotensin blockers. The current treatment provides significant improvement.  Shortness of Breath This is a new problem. The current episode started in the past 7 days. The problem has been gradually improving. Associated symptoms include rhinorrhea and wheezing. Pertinent negatives include no chest pain, fever, headaches, leg swelling or orthopnea. The symptoms are aggravated by lying flat and pollens. The patient has no known risk factors for DVT/PE. She has tried nothing for the symptoms.   Immunization History  Administered Date(s) Administered  . Fluad Quad(high Dose 65+) 11/04/2018  . Influenza, High Dose Seasonal PF 10/21/2017  . Influenza, Seasonal, Injecte, Preservative Fre 10/04/2009  . PFIZER SARS-COV-2 Vaccination 03/05/2019, 03/26/2019  . Pneumococcal Conjugate-13 07/24/2016  . Pneumococcal Polysaccharide-23 11/04/2018  . Tdap 10/04/2009     Lab Results  Component Value Date   CREATININE 0.92 07/12/2018   BUN 21 07/12/2018   NA 136 07/12/2018   K 3.4 (L) 07/12/2018   CL 102 07/12/2018   CO2 24 07/12/2018   Lab Results  Component Value Date   CHOL 183 07/24/2016   HDL 80 07/24/2016   LDLCALC 87 07/24/2016   TRIG 82 07/24/2016   CHOLHDL 2.3 07/24/2016   Lab Results  Component Value Date   TSH 2.590 06/20/2017   No results found for: HGBA1C Lab Results  Component Value Date   WBC 5.5 07/12/2018   HGB 12.9 07/12/2018   HCT 39.3 07/12/2018   MCV 92.7 07/12/2018   PLT 381 07/12/2018   Lab Results  Component Value Date   ALT 10 07/12/2018   AST 19 07/12/2018   ALKPHOS 81 07/12/2018   BILITOT 0.6 07/12/2018     Review of Systems  Constitutional: Positive for unexpected weight change (gained 18 lbs in 10 months). Negative for chills, diaphoresis, fatigue and fever.  HENT: Positive for congestion, rhinorrhea and sneezing. Negative for trouble swallowing and voice change.   Respiratory: Positive for wheezing. Negative for cough, chest tightness, shortness of breath and stridor.   Cardiovascular: Negative for chest pain, palpitations, orthopnea and leg swelling.  Musculoskeletal: Positive for back pain.  Neurological: Negative for dizziness and headaches.  Psychiatric/Behavioral: Negative for dysphoric mood and sleep disturbance. The patient is not nervous/anxious.     Patient Active Problem List   Diagnosis Date Noted  . Umbilical hernia [id number]  . Iron deficiency 07/12/2018  . Chronic midline thoracic back pain 09/06/2017  . Lumbar radiculopathy 08/07/2017  . Diarrhea due to malabsorption 06/20/2017  . Essential hypertension 06/20/2017  . Special screening for malignant neoplasms, colon   . Benign neoplasm of descending colon   . Vitamin D deficiency 07/25/2016  . History of gastric bypass 07/24/2016  . Gait instability 07/24/2016  . Urticaria 07/24/2016  . External hemorrhoids 07/24/2016  . Obesity (BMI 30-39.9) 07/24/2016    Allergies  Allergen Reactions  . Macrobid [Nitrofurantoin] Shortness Of Breath and Nausea And Vomiting    Past Surgical History:  Procedure Laterality Date  .  ABDOMINAL HYSTERECTOMY    . APPENDECTOMY    . BREAST REDUCTION SURGERY Bilateral 01/28/2018   Procedure: MAMMARY REDUCTION  (BREAST);  Surgeon: Cristine Polio, MD;  Location: [facility];  Service: Plastics;  Laterality: Bilateral;  . CHOLECYSTECTOMY    . COLONOSCOPY WITH PROPOFOL N/A  09/04/2016   Procedure: COLONOSCOPY WITH PROPOFOL;  Surgeon: Lucilla Lame, MD;  Location: [facility];  Service: Gastroenterology;  Laterality: N/A;  requests early  . GASTRIC BYPASS  06/20/2011  . HERNIA REPAIR    . POLYPECTOMY  09/04/2016   Procedure: POLYPECTOMY;  Surgeon: Lucilla Lame, MD;  Location: [facility];  Service: Gastroenterology;;    Social History   Tobacco Use  . Smoking status: Never Smoker  . Smokeless tobacco: Never Used  . Tobacco comment: smoking cessation materials not required  Substance Use Topics  . Alcohol use: No  . Drug use: No     Medication list has been reviewed and updated.  Current Meds  Medication Sig  . telmisartan (MICARDIS) 20 MG tablet Take 1 tablet by mouth once daily    PHQ 2/9 Scores 05/08/2019 07/12/2018 04/10/2018 06/20/2017  PHQ - 2 Score 0 0 0 3  PHQ- 9 Score 0 - - 8    BP Readings from Last 3 Encounters:  05/08/19 (!) 144/88  07/12/18 132/78  04/10/18 113/73    Physical Exam Vitals and nursing note reviewed.  Constitutional:      General: She is not in acute distress.    Appearance: She is well-developed. She is obese.  HENT:     Head: Normocephalic and atraumatic.     Right Ear: Tympanic membrane and ear canal normal.     Left Ear: Ear canal normal.     Nose:     Right Sinus: Frontal sinus tenderness present.     Left Sinus: Frontal sinus tenderness present.  Cardiovascular:     Rate and Rhythm: Normal rate and regular rhythm.     Pulses: Normal pulses.     Heart sounds: No murmur.  Pulmonary:     Effort: Pulmonary effort is normal. No respiratory distress.     Breath sounds: No wheezing or rhonchi.  Musculoskeletal:        General: Normal range of motion.     Cervical back: Normal range of motion.  Lymphadenopathy:     Cervical: No cervical adenopathy.  Skin:    General: Skin is warm and dry.     Findings: No rash.  Neurological:     Mental Status: She is alert and oriented to person, place,  and time.  Psychiatric:        Attention and Perception: Attention normal.        Mood and Affect: Mood normal.        Behavior: Behavior normal.     Wt Readings from Last 3 Encounters:  05/08/19 228 lb (103.4 kg)  07/12/18 210 lb (95.3 kg)  01/28/18 242 lb 15.2 oz (110.2 kg)    BP (!) 144/88   Pulse 88   Temp (!) 97.3 F (36.3 C) (Temporal)   Ht 5\' 6"  (1.676 m)   Wt 228 lb (103.4 kg)   SpO2 93%   BMI 36.80 kg/m   Assessment and Plan: 1. Essential hypertension Resume micardis daily - new Rx given Work on diet and exercise Follow up in 6 months - telmisartan (MICARDIS) 20 MG tablet; Take 1 tablet (20 mg total) by mouth daily.  Dispense: 90 tablet; Refill:  1 - CBC with Differential/Platelet - TSH + free T4 - Lipid panel  2. Environmental and seasonal allergies Xyzal 5 mg daily - samples given  3. Vitamin D deficiency Check labs and advise on supplementation - VITAMIN D 25 Hydroxy (Vit-D Deficiency, Fractures)  4. Iron deficiency Hx of iron def due to bariatric surgery - CBC with Differential/Platelet  5. 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 Any errors are unintentional.  Halina Maidens, MD [facility]  05/08/2019

## 2019-06-13 ENCOUNTER — Ambulatory Visit: Payer: Medicare HMO | Admitting: Internal Medicine

## 2019-10-06 ENCOUNTER — Ambulatory Visit: Payer: Medicare HMO

## 2019-11-10 ENCOUNTER — Ambulatory Visit: Payer: Medicare HMO | Admitting: Internal Medicine

## 2019-11-12 ENCOUNTER — Ambulatory Visit (INDEPENDENT_AMBULATORY_CARE_PROVIDER_SITE_OTHER): Payer: Medicare HMO

## 2019-11-12 DIAGNOSIS — Z1231 Encounter for screening mammogram for malignant neoplasm of breast: Secondary | ICD-10-CM | POA: Diagnosis not present

## 2019-11-12 DIAGNOSIS — Z0000 Encounter for general adult medical examination without abnormal findings: Secondary | ICD-10-CM

## 2019-11-12 DIAGNOSIS — Z780 Asymptomatic menopausal state: Secondary | ICD-10-CM | POA: Diagnosis not present

## 2019-11-12 NOTE — Patient Instructions (Signed)
Jeanette [name] , Thank you for taking time to come for your Medicare Wellness Visit. I appreciate your ongoing commitment to your health goals. Please review the following plan we discussed and let me know if I can assist you in the future.   Screening recommendations/referrals: Colonoscopy: done 09/04/16. Repeat in 2023. Mammogram:  Done 10/11/17. Please call [phone] to schedule your mammogram and bone density screening.  Bone Density: due Recommended yearly ophthalmology/optometry visit for glaucoma screening and checkup Recommended yearly dental visit for hygiene and checkup  Vaccinations: Influenza vaccine: due  Pneumococcal vaccine: done 11/04/18 Tdap vaccine: due Shingles vaccine: Shingrix discussed. Please contact your pharmacy for coverage information.  Covid-19: done 03/05/19 & 03/26/19  Advanced directives: Advance directive discussed with you today. I have provided a copy for you to complete at home and have notarized. Once this is complete please bring a copy in to our office so we can scan it into your chart.  Conditions/risks identified: Recommend drinking 6-8 glasses of water per day   Next appointment: Follow up in one year for your annual wellness visit    Preventive Care 65 Years and Older, Female Preventive care refers to lifestyle choices and visits with your health care provider that can promote health and wellness. What does preventive care include?  A yearly physical exam. This is also called an annual well check.  Dental exams once or twice a year.  Routine eye exams. Ask your health care provider how often you should have your eyes checked.  Personal lifestyle choices, including:  Daily care of your teeth and gums.  Regular physical activity.  Eating a healthy diet.  Avoiding tobacco and drug use.  Limiting alcohol use.  Practicing safe sex.  Taking low-dose aspirin every day.  Taking vitamin and mineral supplements as recommended by your health  care provider. What happens during an annual well check? The services and screenings done by your health care provider during your annual well check will depend on your age, overall health, lifestyle risk factors, and family history of disease. Counseling  Your health care provider may ask you questions about your:  Alcohol use.  Tobacco use.  Drug use.  Emotional well-being.  Home and relationship well-being.  Sexual activity.  Eating habits.  History of falls.  Memory and ability to understand (cognition).  Work and work Statistician.  Reproductive health. Screening  You may have the following tests or measurements:  Height, weight, and BMI.  Blood pressure.  Lipid and cholesterol levels. These may be checked every 5 years, or more frequently if you are over 49 years old.  Skin check.  Lung cancer screening. You may have this screening every year starting at age 97 if you have a 30-pack-year history of smoking and currently smoke or have quit within the past 15 years.  Fecal occult blood test (FOBT) of the stool. You may have this test every year starting at age 64.  Flexible sigmoidoscopy or colonoscopy. You may have a sigmoidoscopy every 5 years or a colonoscopy every 10 years starting at age 27.  Hepatitis C blood test.  Hepatitis B blood test.  Sexually transmitted disease (STD) testing.  Diabetes screening. This is done by checking your blood sugar (glucose) after you have not eaten for a while (fasting). You may have this done every 1-3 years.  Bone density scan. This is done to screen for osteoporosis. You may have this done starting at age 73.  Mammogram. This may be done every 1-2 years.  Talk to your health care provider about how often you should have regular mammograms. Talk with your health care provider about your test results, treatment options, and if necessary, the need for more tests. Vaccines  Your health care provider may recommend certain  vaccines, such as:  Influenza vaccine. This is recommended every year.  Tetanus, diphtheria, and acellular pertussis (Tdap, Td) vaccine. You may need a Td booster every 10 years.  Zoster vaccine. You may need this after age 23.  Pneumococcal 13-valent conjugate (PCV13) vaccine. One dose is recommended after age 22.  Pneumococcal polysaccharide (PPSV23) vaccine. One dose is recommended after age 70. Talk to your health care provider about which screenings and vaccines you need and how often you need them. This information is not intended to replace advice given to you by your health care provider. Make sure you discuss any questions you have with your health care provider. Document Released: 01/29/2015 Document Revised: 09/22/2015 Document Reviewed: 11/03/2014 Elsevier Interactive Patient Education  2017 [place] Prevention in the Home Falls can cause injuries. They can happen to people of all ages. There are many things you can do to make your home safe and to help prevent falls. What can I do on the outside of my home?  Regularly fix the edges of walkways and driveways and fix any cracks.  Remove anything that might make you trip as you walk through a door, such as a raised step or threshold.  Trim any bushes or trees on the path to your home.  Use bright outdoor lighting.  Clear any walking paths of anything that might make someone trip, such as rocks or tools.  Regularly check to see if handrails are loose or broken. Make sure that both sides of any steps have handrails.  Any raised decks and porches should have guardrails on the edges.  Have any leaves, snow, or ice cleared regularly.  Use sand or salt on walking paths during winter.  Clean up any spills in your garage right away. This includes oil or grease spills. What can I do in the bathroom?  Use night lights.  Install grab bars by the toilet and in the tub and shower. Do not use towel bars as grab  bars.  Use non-skid mats or decals in the tub or shower.  If you need to sit down in the shower, use a plastic, non-slip stool.  Keep the floor dry. Clean up any water that spills on the floor as soon as it happens.  Remove soap buildup in the tub or shower regularly.  Attach bath mats securely with double-sided non-slip rug tape.  Do not have throw rugs and other things on the floor that can make you trip. What can I do in the bedroom?  Use night lights.  Make sure that you have a light by your bed that is easy to reach.  Do not use any sheets or blankets that are too big for your bed. They should not hang down onto the floor.  Have a firm chair that has side arms. You can use this for support while you get dressed.  Do not have throw rugs and other things on the floor that can make you trip. What can I do in the kitchen?  Clean up any spills right away.  Avoid walking on wet floors.  Keep items that you use a lot in easy-to-reach places.  If you need to reach something above you, use a strong step stool  that has a grab bar.  Keep electrical cords out of the way.  Do not use floor polish or wax that makes floors slippery. If you must use wax, use non-skid floor wax.  Do not have throw rugs and other things on the floor that can make you trip. What can I do with my stairs?  Do not leave any items on the stairs.  Make sure that there are handrails on both sides of the stairs and use them. Fix handrails that are broken or loose. Make sure that handrails are as long as the stairways.  Check any carpeting to make sure that it is firmly attached to the stairs. Fix any carpet that is loose or worn.  Avoid having throw rugs at the top or bottom of the stairs. If you do have throw rugs, attach them to the floor with carpet tape.  Make sure that you have a light switch at the top of the stairs and the bottom of the stairs. If you do not have them, ask someone to add them for  you. What else can I do to help prevent falls?  Wear shoes that:  Do not have high heels.  Have rubber bottoms.  Are comfortable and fit you well.  Are closed at the toe. Do not wear sandals.  If you use a stepladder:  Make sure that it is fully opened. Do not climb a closed stepladder.  Make sure that both sides of the stepladder are locked into place.  Ask someone to hold it for you, if possible.  Clearly mark and make sure that you can see:  Any grab bars or handrails.  First and last steps.  Where the edge of each step is.  Use tools that help you move around (mobility aids) if they are needed. These include:  Canes.  Walkers.  Scooters.  Crutches.  Turn on the lights when you go into a dark area. Replace any light bulbs as soon as they burn out.  Set up your furniture so you have a clear path. Avoid moving your furniture around.  If any of your floors are uneven, fix them.  If there are any pets around you, be aware of where they are.  Review your medicines with your doctor. Some medicines can make you feel dizzy. This can increase your chance of falling. Ask your doctor what other things that you can do to help prevent falls. This information is not intended to replace advice given to you by your health care provider. Make sure you discuss any questions you have with your health care provider. Document Released: 10/29/2008 Document Revised: 06/10/2015 Document Reviewed: 02/06/2014 Elsevier Interactive Patient Education  2017 [employer].

## 2019-11-12 NOTE — Progress Notes (Signed)
Subjective:   Jeanette [name] is a 69 y.o. female who presents for an Initial Medicare Annual Wellness Visit.  Virtual Visit via Telephone Note  I connected with  Jeanette [name] on 11/12/19 at  2:40 PM EDT by telephone and verified that I am speaking with the correct person using two identifiers.  Medicare Annual Wellness visit completed telephonically due to Covid-19 pandemic.   Location: Patient: home Provider: [facility]   I discussed the limitations, risks, security and privacy concerns of performing an evaluation and management service by telephone and the availability of in person appointments. The patient expressed understanding and agreed to proceed.  Unable to perform video visit due to video visit attempted and failed and/or patient does not have video capability.   Some vital signs may be absent or patient reported.   Jeanette Marker, LPN    Review of Systems     Cardiac Risk Factors include: advanced age (>64men, >90 women);hypertension     Objective:    There were no vitals filed for this visit. There is no height or weight on file to calculate BMI.  Advanced Directives 11/12/2019 01/28/2018 01/23/2018 03/08/2017 01/13/2017 09/04/2016 08/25/2016  Does Patient Have a Medical Advance Directive? No No No No No No No  Would patient like information on creating a medical advance directive? Yes (MAU/Ambulatory/Procedural Areas - Information given) No - Patient declined No - Patient declined No - Patient declined - No - Patient declined -    Current Medications (verified) Outpatient Encounter Medications as of 11/12/2019  Medication Sig  . telmisartan (MICARDIS) 20 MG tablet Take 1 tablet (20 mg total) by mouth daily.   No facility-administered encounter medications on file as of 11/12/2019.    Allergies (verified) Macrobid [nitrofurantoin]   History: Past Medical History:  Diagnosis Date  . Anxiety   . Depression   . Family history of adverse reaction to  anesthesia    Sister - slow to wake  . Heart murmur    followed by PCP  . Hypertension    before gastric bypass  . Knee pain   . Macromastia   . Sleep apnea    had gastric bypass and lost wt, not using now  . Vertigo    no episodes in over 2 yrs  . Wears dentures    full upper and lower   Past Surgical History:  Procedure Laterality Date  . ABDOMINAL HYSTERECTOMY    . APPENDECTOMY    . BREAST REDUCTION SURGERY Bilateral 01/28/2018   Procedure: MAMMARY REDUCTION  (BREAST);  Surgeon: Cristine Polio, MD;  Location: [place];  Service: Plastics;  Laterality: Bilateral;  . CHOLECYSTECTOMY    . COLONOSCOPY WITH PROPOFOL N/A 09/04/2016   Procedure: COLONOSCOPY WITH PROPOFOL;  Surgeon: Lucilla Lame, MD;  Location: [place];  Service: Gastroenterology;  Laterality: N/A;  requests early  . GASTRIC BYPASS  06/20/2011  . HERNIA REPAIR    . POLYPECTOMY  09/04/2016   Procedure: POLYPECTOMY;  Surgeon: Lucilla Lame, MD;  Location: [place];  Service: Gastroenterology;;   Family History  Problem Relation Age of Onset  . Cancer Father        stomach   Social History   Socioeconomic History  . Marital status: Married    Spouse name: Not on file  . Number of children: 2  . Years of education: Not on file  . Highest education level: Not on file  Occupational History  . Not on file  Tobacco  Use  . Smoking status: Never Smoker  . Smokeless tobacco: Never Used  . Tobacco comment: smoking cessation materials not required  Vaping Use  . Vaping Use: Never used  Substance and Sexual Activity  . Alcohol use: No  . Drug use: No  . Sexual activity: Not on file  Other Topics Concern  . Not on file  Social History Narrative  . Not on file   Social Determinants of Health   Financial Resource Strain: Low Risk   . Difficulty of Paying Living Expenses: Not very hard  Food Insecurity: No Food Insecurity  . Worried About Charity fundraiser in the Last  Year: Never true  . Ran Out of Food in the Last Year: Never true  Transportation Needs: No Transportation Needs  . Lack of Transportation (Medical): No  . Lack of Transportation (Non-Medical): No  Physical Activity: Inactive  . Days of Exercise per Week: 0 days  . Minutes of Exercise per Session: 0 min  Stress: Stress Concern Present  . Feeling of Stress : Very much  Social Connections: Moderately Isolated  . Frequency of Communication with Friends and Family: More than three times a week  . Frequency of Social Gatherings with Friends and Family: Three times a week  . Attends Religious Services: Never  . Active Member of Clubs or Organizations: No  . Attends Archivist Meetings: Never  . Marital Status: Married    Tobacco Counseling Counseling given: Not Answered Comment: smoking cessation materials not required   Clinical Intake:  Pre-visit preparation completed: Yes  Pain : No/denies pain     Nutritional Risks: None Diabetes: No  How often do you need to have someone help you when you read instructions, pamphlets, or other written materials from your doctor or pharmacy?: 1 - Never    Interpreter Needed?: No  Information entered by :: Jeanette Marker LPN   Activities of Daily Living In your present state of health, do you have any difficulty performing the following activities: 11/12/2019 05/08/2019  Hearing? N N  Comment declines hearing aids -  Vision? N N  Difficulty concentrating or making decisions? N N  Walking or climbing stairs? N N  Dressing or bathing? N N  Doing errands, shopping? N N  Preparing Food and eating ? N -  Using the Toilet? N -  In the past six months, have you accidently leaked urine? Y -  Comment wears depends for protection -  Do you have problems with loss of bowel control? Y -  Managing your Medications? N -  Managing your Finances? N -  Housekeeping or managing your Housekeeping? N -  Some recent data might be hidden     Patient Care Team: Glean Hess, MD as PCP - General (Internal Medicine) Lovell Sheehan, MD as Consulting Physician (Orthopedic Surgery) Bonner Puna, MD as Referring Physician [facility])  Indicate any recent Medical Services you may have received from other than [facility] providers in the past year (date may be approximate).     Assessment:   This is a routine wellness examination for [place].  Hearing/Vision screen  Hearing Screening   125Hz  250Hz  500Hz  1000Hz  2000Hz  3000Hz  4000Hz  6000Hz  8000Hz   Right ear:           Left ear:           Comments: Pt denies hearing difficulty  Vision Screening Comments: Annual vision screenings done at [facility] issues and exercise activities discussed: Current  Exercise Habits: The patient does not participate in regular exercise at present, Exercise limited by: None identified  Goals    . DIET - INCREASE WATER INTAKE     Recommend drinking 6-8 glasses of water per day       Depression Screen PHQ 2/9 Scores 11/12/2019 05/08/2019 07/12/2018 04/10/2018 06/20/2017 08/25/2016  PHQ - 2 Score 1 0 0 0 3 0  PHQ- 9 Score - 0 - - 8 0    Fall Risk Fall Risk  11/12/2019 05/08/2019 07/12/2018 08/25/2016  Falls in the past year? 0 0 0 No  Number falls in past yr: 0 0 0 -  Injury with Fall? 0 0 0 -  Risk for fall due to : No Fall Risks No Fall Risks - -  Follow up Falls prevention discussed Falls evaluation completed Falls evaluation completed -    Any stairs in or around the home? Yes  If so, are there any without handrails? No  Home free of loose throw rugs in walkways, pet beds, electrical cords, etc? Yes  Adequate lighting in your home to reduce risk of falls? Yes   ASSISTIVE DEVICES UTILIZED TO PREVENT FALLS:  Life alert? No  Use of a cane, walker or w/c? No  Grab bars in the bathroom? No  Shower chair or bench in shower? No  Elevated toilet seat or a handicapped toilet? No   TIMED UP AND GO:  Was the test  performed? No . Telephonic visit  Cognitive Function:     6CIT Screen 11/12/2019  What Year? 0 points  What month? 0 points  What time? 0 points  Count back from 20 0 points  Months in reverse 0 points  Repeat phrase 0 points  Total Score 0    Immunizations Immunization History  Administered Date(s) Administered  . Fluad Quad(high Dose 65+) 11/04/2018  . Influenza, High Dose Seasonal PF 10/21/2017  . Influenza, Seasonal, Injecte, Preservative Fre 10/04/2009  . PFIZER SARS-COV-2 Vaccination 03/05/2019, 03/26/2019  . Pneumococcal Conjugate-13 07/24/2016  . Pneumococcal Polysaccharide-23 11/04/2018  . Tdap 10/04/2009    TDAP status: Due, Education has been provided regarding the importance of this vaccine. Advised may receive this vaccine at local pharmacy or Health Dept. Aware to provide a copy of the vaccination record if obtained from local pharmacy or Health Dept. Verbalized acceptance and understanding.   Flu vaccine: due for 2021-2022 season  Pneumococcal vaccine status: Up to date   Covid-19 vaccine status: Completed vaccines  Qualifies for Shingles Vaccine? Yes   Zostavax completed No   Shingrix Completed?: No.    Education has been provided regarding the importance of this vaccine. Patient has been advised to call insurance company to determine out of pocket expense if they have not yet received this vaccine. Advised may also receive vaccine at local pharmacy or Health Dept. Verbalized acceptance and understanding.  Screening Tests Health Maintenance  Topic Date Due  . DEXA SCAN  Never done  . MAMMOGRAM  10/12/2018  . INFLUENZA VACCINE  08/17/2019  . TETANUS/TDAP  10/05/2019  . COLONOSCOPY  09/04/2021  . COVID-19 Vaccine  Completed  . Hepatitis C Screening  Completed  . PNA vac Low Risk Adult  Completed    Health Maintenance  Health Maintenance Due  Topic Date Due  . DEXA SCAN  Never done  . MAMMOGRAM  10/12/2018  . INFLUENZA VACCINE  08/17/2019  .  TETANUS/TDAP  10/05/2019    Colorectal cancer screening: Completed 09/04/16/. Repeat every 5 years  Mammogram status: Completed 10/11/17. Repeat every year Ordered today.   Bone Density status: Ordered today. Pt provided with contact info and advised to call to schedule appt.  Lung Cancer Screening: (Low Dose CT Chest recommended if Age 23-80 years, 30 pack-year currently smoking OR have quit w/in 15years.) does not qualify.   Additional Screening:  Hepatitis C Screening: does qualify; Completed 07/24/16  Vision Screening: Recommended annual ophthalmology exams for early detection of glaucoma and other disorders of the eye. Is the patient up to date with their annual eye exam?  Yes  Who is the provider or what is the name of the office in which the patient attends annual eye exams? [facility]  Dental Screening: Recommended annual dental exams for proper oral hygiene  Community Resource Referral / Chronic Care Management: CRR required this visit?  No   CCM required this visit?  No      Plan:     I have personally reviewed and noted the following in the patient's chart:   . Medical and social history . Use of alcohol, tobacco or illicit drugs  . Current medications and supplements . Functional ability and status . Nutritional status . Physical activity . Advanced directives . List of other physicians . Hospitalizations, surgeries, and ER visits in previous 12 months . Vitals . Screenings to include cognitive, depression, and falls . Referrals and appointments  In addition, I have reviewed and discussed with patient certain preventive protocols, quality metrics, and best practice recommendations. A written personalized care plan for preventive services as well as general preventive health recommendations were provided to patient.     Jeanette Marker, LPN   [id number]   Nurse Notes: none

## 2019-12-29 ENCOUNTER — Other Ambulatory Visit: Payer: Medicare HMO

## 2020-01-28 ENCOUNTER — Telehealth: Payer: Self-pay

## 2020-01-28 NOTE — Telephone Encounter (Signed)
Called pt made a appt for BP follow up. Pt stated she hasn't been taking her BP meds as prescribed. Has been taking them as needed.  KP

## 2020-01-28 NOTE — Telephone Encounter (Signed)
Copied from [place] [id number]. Topic: General - Inquiry >> Jan 28, 2020 11:57 AM Gillis Ends D wrote: Reason for GBE:EFEOFH calling from [employer] wanted to let the doctor know that the patient is not taking her blood pressure medication as prescribed. She only takes it when she needs it. She would like for you to reach out to the patient and get an update on the patient. She also wanted to tell you that she can fill her scripts with a 90 day if needed.

## 2020-02-03 ENCOUNTER — Ambulatory Visit: Payer: Self-pay | Admitting: Internal Medicine

## 2020-02-04 ENCOUNTER — Other Ambulatory Visit: Payer: Self-pay | Admitting: Internal Medicine

## 2020-02-04 DIAGNOSIS — I10 Essential (primary) hypertension: Secondary | ICD-10-CM

## 2020-02-05 NOTE — Telephone Encounter (Signed)
Pt needs appt for BP f/u.  KP

## 2020-02-05 NOTE — Telephone Encounter (Signed)
Requested medications are due for refill today yes  Requested medications are on the active medication list yes  Last refill 7/16  Last visit 07/2019  Future visit scheduled no, pt canceled  Notes to clinic Failed protocol due to no valid visit within 6  months.

## 2020-02-05 NOTE — Telephone Encounter (Signed)
Left message for patient to set up appointment for BP.

## 2020-02-05 NOTE — Telephone Encounter (Signed)
SORRY, I PUT WRONG DATE OF LAST VISIT ON PRIOR MESSAGE  Last visit April 2021  Future visit scheduled no  Notes to clinic Failed protocol due to no valid visit within 6  months.

## 2020-02-06 ENCOUNTER — Ambulatory Visit: Payer: Self-pay | Admitting: Internal Medicine

## 2020-03-16 ENCOUNTER — Other Ambulatory Visit: Payer: Self-pay | Admitting: Internal Medicine

## 2020-03-16 ENCOUNTER — Other Ambulatory Visit: Payer: Medicare HMO

## 2020-03-16 DIAGNOSIS — Z20822 Contact with and (suspected) exposure to covid-19: Secondary | ICD-10-CM

## 2020-03-16 DIAGNOSIS — I10 Essential (primary) hypertension: Secondary | ICD-10-CM

## 2020-03-16 NOTE — Telephone Encounter (Signed)
Future visit in 2 wks. Per protocol no valid encounter within 6 months

## 2020-03-17 LAB — SARS-COV-2, NAA 2 DAY TAT

## 2020-03-17 LAB — NOVEL CORONAVIRUS, NAA: SARS-CoV-2, NAA: NOT DETECTED

## 2020-04-02 ENCOUNTER — Encounter: Payer: Self-pay | Admitting: Internal Medicine

## 2020-04-02 ENCOUNTER — Ambulatory Visit (INDEPENDENT_AMBULATORY_CARE_PROVIDER_SITE_OTHER): Payer: Medicare HMO | Admitting: Internal Medicine

## 2020-04-02 ENCOUNTER — Other Ambulatory Visit: Payer: Self-pay

## 2020-04-02 ENCOUNTER — Ambulatory Visit
Admission: RE | Admit: 2020-04-02 | Discharge: 2020-04-02 | Disposition: A | Discharge status: Home / Self Care | Payer: Medicare HMO | Source: Ambulatory Visit | Attending: Internal Medicine | Admitting: Internal Medicine

## 2020-04-02 ENCOUNTER — Other Ambulatory Visit
Admission: RE | Admit: 2020-04-02 | Discharge: 2020-04-02 | Disposition: A | Discharge status: Home / Self Care | Payer: Medicare HMO | Source: Home / Self Care | Attending: Internal Medicine | Admitting: Internal Medicine

## 2020-04-02 ENCOUNTER — Ambulatory Visit
Admission: RE | Admit: 2020-04-02 | Discharge: 2020-04-02 | Disposition: A | Discharge status: Home / Self Care | Payer: Medicare HMO | Attending: Internal Medicine | Admitting: Internal Medicine

## 2020-04-02 VITALS — BP 124/78 | HR 98 | Temp 98.3°F | Ht 66.0 in | Wt 231.0 lb

## 2020-04-02 DIAGNOSIS — M546 Pain in thoracic spine: Secondary | ICD-10-CM | POA: Diagnosis not present

## 2020-04-02 DIAGNOSIS — J3089 Other allergic rhinitis: Secondary | ICD-10-CM | POA: Diagnosis not present

## 2020-04-02 DIAGNOSIS — I10 Essential (primary) hypertension: Secondary | ICD-10-CM | POA: Diagnosis not present

## 2020-04-02 DIAGNOSIS — M8588 Other specified disorders of bone density and structure, other site: Secondary | ICD-10-CM | POA: Diagnosis not present

## 2020-04-02 DIAGNOSIS — M47812 Spondylosis without myelopathy or radiculopathy, cervical region: Secondary | ICD-10-CM | POA: Diagnosis not present

## 2020-04-02 DIAGNOSIS — E559 Vitamin D deficiency, unspecified: Secondary | ICD-10-CM

## 2020-04-02 DIAGNOSIS — M50322 Other cervical disc degeneration at C5-C6 level: Secondary | ICD-10-CM | POA: Insufficient documentation

## 2020-04-02 DIAGNOSIS — M4802 Spinal stenosis, cervical region: Secondary | ICD-10-CM | POA: Insufficient documentation

## 2020-04-02 LAB — CBC WITH DIFFERENTIAL/PLATELET
Abs Immature Granulocytes: 0.01 10*3/uL (ref 0.00–0.07)
Basophils Absolute: 0.1 10*3/uL (ref 0.0–0.1)
Basophils Relative: 1 %
Eosinophils Absolute: 0.1 10*3/uL (ref 0.0–0.5)
Eosinophils Relative: 2 %
HCT: 39.6 % (ref 36.0–46.0)
Hemoglobin: 12.7 g/dL (ref 12.0–15.0)
Immature Granulocytes: 0 %
Lymphocytes Relative: 35 %
Lymphs Abs: 1.7 10*3/uL (ref 0.7–4.0)
MCH: 29.3 pg (ref 26.0–34.0)
MCHC: 32.1 g/dL (ref 30.0–36.0)
MCV: 91.5 fL (ref 80.0–100.0)
Monocytes Absolute: 0.3 10*3/uL (ref 0.1–1.0)
Monocytes Relative: 7 %
Neutro Abs: 2.7 10*3/uL (ref 1.7–7.7)
Neutrophils Relative %: 55 %
Platelets: 310 10*3/uL (ref 150–400)
RBC: 4.33 MIL/uL (ref 3.87–5.11)
RDW: 14.1 % (ref 11.5–15.5)
WBC: 5 10*3/uL (ref 4.0–10.5)
nRBC: 0 % (ref 0.0–0.2)

## 2020-04-02 LAB — COMPREHENSIVE METABOLIC PANEL
ALT: 14 U/L (ref 0–44)
AST: 20 U/L (ref 15–41)
Albumin: 3.6 g/dL (ref 3.5–5.0)
Alkaline Phosphatase: 78 U/L (ref 38–126)
Anion gap: 9 (ref 5–15)
BUN: 31 mg/dL — ABNORMAL HIGH (ref 8–23)
CO2: 23 mmol/L (ref 22–32)
Calcium: 8.9 mg/dL (ref 8.9–10.3)
Chloride: 107 mmol/L (ref 98–111)
Creatinine, Ser: 0.82 mg/dL (ref 0.44–1.00)
GFR, Estimated: >60 mL/min (ref >60)
Glucose, Bld: 121 mg/dL — ABNORMAL HIGH (ref 70–99)
Potassium: 3.8 mmol/L (ref 3.5–5.1)
Sodium: 139 mmol/L (ref 135–145)
Total Bilirubin: 0.3 mg/dL (ref 0.3–1.2)
Total Protein: 7.3 g/dL (ref 6.5–8.1)

## 2020-04-02 LAB — TSH: TSH: 2.698 u[IU]/mL (ref 0.350–4.500)

## 2020-04-02 LAB — VITAMIN D 25 HYDROXY (VIT D DEFICIENCY, FRACTURES): Vit D, 25-Hydroxy: 24 ng/mL — ABNORMAL LOW (ref 30–100)

## 2020-04-02 MED ORDER — TELMISARTAN 20 MG PO TABS: 10.0000 mg | ORAL_TABLET | Freq: Every day | ORAL | 3 refills | Status: AC

## 2020-04-02 MED ORDER — BACLOFEN 10 MG PO TABS: 10.0000 mg | ORAL_TABLET | Freq: Three times a day (TID) | ORAL | 0 refills | Status: DC

## 2020-04-02 NOTE — Progress Notes (Signed)
Date:  04/02/2020   Name:  Jeanette [name]   DOB:  [birth year]   MRN:  [id number]   Chief Complaint: Hypertension  Hypertension This is a chronic problem. The problem is controlled. Pertinent negatives include no chest pain, headaches, palpitations or shortness of breath. Past treatments include angiotensin blockers. The current treatment provides significant improvement. There are no compliance problems.   Back Pain This is a chronic problem. The problem has been waxing and waning since onset. The pain is present in the thoracic spine. The quality of the pain is described as aching and cramping. The pain does not radiate. The pain is moderate. The symptoms are aggravated by standing. Pertinent negatives include no abdominal pain, chest pain, headaches, numbness or weakness. She has tried analgesics and bed rest for the symptoms. The treatment provided significant relief.  Environmental allergies - runny nose, sneezing and watery itchy eyes.  Lab Results  Component Value Date   CREATININE 0.76 05/08/2019   BUN 29 (H) 05/08/2019   NA 138 05/08/2019   K 3.7 05/08/2019   CL 104 05/08/2019   CO2 26 05/08/2019   Lab Results  Component Value Date   CHOL 217 (H) 05/08/2019   HDL 82 05/08/2019   LDLCALC 122 (H) 05/08/2019   TRIG 66 05/08/2019   CHOLHDL 2.6 05/08/2019   Lab Results  Component Value Date   TSH 3.307 05/08/2019   No results found for: HGBA1C Lab Results  Component Value Date   WBC 4.5 05/08/2019   HGB 12.5 05/08/2019   HCT 38.2 05/08/2019   MCV 91.4 05/08/2019   PLT 296 05/08/2019   Lab Results  Component Value Date   ALT 14 05/08/2019   AST 19 05/08/2019   ALKPHOS 70 05/08/2019   BILITOT 0.6 05/08/2019   Last vitamin D Lab Results  Component Value Date   VD25OH 39.02 05/08/2019      Review of Systems  Constitutional: Negative for fatigue and unexpected weight change.  HENT: Negative for nosebleeds.   Eyes: Negative for visual disturbance.   Respiratory: Negative for cough, chest tightness, shortness of breath and wheezing.   Cardiovascular: Negative for chest pain, palpitations and leg swelling.  Gastrointestinal: Negative for abdominal pain, constipation and diarrhea.  Musculoskeletal: Positive for back pain. Negative for joint swelling and myalgias.  Allergic/Immunologic: Positive for environmental allergies.  Neurological: Negative for dizziness, weakness, light-headedness, numbness and headaches.    Patient Active Problem List   Diagnosis Date Noted  . Umbilical hernia 33/00/7622  . Iron deficiency 07/12/2018  . Chronic midline thoracic back pain 09/06/2017  . Lumbar radiculopathy 08/07/2017  . Diarrhea due to malabsorption 06/20/2017  . Essential hypertension 06/20/2017  . Special screening for malignant neoplasms, colon   . Benign neoplasm of descending colon   . History of gastric bypass 07/24/2016  . Gait instability 07/24/2016  . Urticaria 07/24/2016  . External hemorrhoids 07/24/2016  . Obesity (BMI 30-39.9) 07/24/2016    Allergies  Allergen Reactions  . Macrobid [Nitrofurantoin] Shortness Of Breath and Nausea And Vomiting    Past Surgical History:  Procedure Laterality Date  . ABDOMINAL HYSTERECTOMY    . APPENDECTOMY    . BREAST REDUCTION SURGERY Bilateral 01/28/2018   Procedure: MAMMARY REDUCTION  (BREAST);  Surgeon: Cristine Polio, MD;  Location: [place];  Service: Plastics;  Laterality: Bilateral;  . CHOLECYSTECTOMY    . COLONOSCOPY WITH PROPOFOL N/A 09/04/2016   Procedure: COLONOSCOPY WITH PROPOFOL;  Surgeon: Lucilla Lame, MD;  Location:  [place];  Service: Gastroenterology;  Laterality: N/A;  requests early  . GASTRIC BYPASS  06/20/2011  . HERNIA REPAIR    . POLYPECTOMY  09/04/2016   Procedure: POLYPECTOMY;  Surgeon: Lucilla Lame, MD;  Location: [place];  Service: Gastroenterology;;    Social History   Tobacco Use  . Smoking status: Never Smoker   . Smokeless tobacco: Never Used  . Tobacco comment: smoking cessation materials not required  Vaping Use  . Vaping Use: Never used  Substance Use Topics  . Alcohol use: No  . Drug use: No     Medication list has been reviewed and updated.  Current Meds  Medication Sig  . telmisartan (MICARDIS) 20 MG tablet TAKE 1 TABLET BY MOUTH ONCE DAILY PATIENT  NEEDS  APPOINTMNET  FOR  FURTHER  REFILLS    PHQ 2/9 Scores 04/02/2020 11/12/2019 05/08/2019 07/12/2018  PHQ - 2 Score 0 1 0 0  PHQ- 9 Score 0 - 0 -    GAD 7 : Generalized Anxiety Score 04/02/2020 05/08/2019  Nervous, Anxious, on Edge 1 1  Control/stop worrying 1 0  Worry too much - different things 1 0  Trouble relaxing 0 0  Restless 0 0  Easily annoyed or irritable 0 0  Afraid - awful might happen 0 0  Total GAD 7 Score 3 1  Anxiety Difficulty - Not difficult at all    BP Readings from Last 3 Encounters:  04/02/20 124/78  05/08/19 (!) 144/88  07/12/18 132/78    Physical Exam Vitals and nursing note reviewed.  Constitutional:      General: She is not in acute distress.    Appearance: She is well-developed.  HENT:     Head: Normocephalic and atraumatic.  Neck:     Vascular: No carotid bruit.  Cardiovascular:     Rate and Rhythm: Normal rate and regular rhythm.     Pulses: Normal pulses.     Heart sounds: No murmur heard.   Pulmonary:     Effort: Pulmonary effort is normal. No respiratory distress.     Breath sounds: No wheezing.  Musculoskeletal:     Cervical back: Tenderness present. No spasms. Decreased range of motion (left rotatioin).     Right lower leg: No edema.     Left lower leg: No edema.  Lymphadenopathy:     Cervical: No cervical adenopathy.  Skin:    General: Skin is warm and dry.     Findings: No rash.  Neurological:     Mental Status: She is alert and oriented to person, place, and time.     Sensory: Sensation is intact.     Motor: Motor function is intact.     Deep Tendon Reflexes:      Reflex Scores:      Bicep reflexes are 2+ on the right side and 2+ on the left side. Psychiatric:        Mood and Affect: Mood normal.        Behavior: Behavior normal.     Wt Readings from Last 3 Encounters:  04/02/20 231 lb (104.8 kg)  05/08/19 228 lb (103.4 kg)  07/12/18 210 lb (95.3 kg)    BP 124/78   Pulse 98   Temp 98.3 F (36.8 C) (Oral)   Ht 5\' 6"  (1.676 m)   Wt 231 lb (104.8 kg)   SpO2 97%   BMI 37.28 kg/m   Assessment and Plan: 1. Essential hypertension Clinically stable exam with well controlled  BP.  However taking meds every other day.  Recommend 1/2 tablet daily. Tolerating medications without side effects at this time. Pt to continue current regimen and low sodium diet; benefits of regular exercise as able discussed. - CBC with Differential/Platelet - Comprehensive metabolic panel - TSH - telmisartan (MICARDIS) 20 MG tablet; Take 0.5 tablets (10 mg total) by mouth daily.  Dispense: 45 tablet; Refill: 3  2. Environmental and seasonal allergies Over the counter Xyzal or Allegra as needed  3. Right-sided thoracic back pain, unspecified chronicity Will get plain films Take Baclofen and Tylenol as needed - DG Cervical Spine Complete; Future - DG Thoracic Spine 2 View; Future  4. Vitamin D deficiency No longer taking supplements - VITAMIN D 25 Hydroxy (Vit-D Deficiency, Fractures)   Partially dictated using Editor, commissioning. Any errors are unintentional.  Halina Maidens, MD [facility]  04/02/2020

## 2020-04-02 NOTE — Patient Instructions (Signed)
Plain Xyzal is good for allergies.

## 2020-04-05 ENCOUNTER — Other Ambulatory Visit: Payer: Self-pay

## 2020-04-05 ENCOUNTER — Telehealth: Payer: Self-pay | Admitting: Internal Medicine

## 2020-04-05 ENCOUNTER — Ambulatory Visit: Payer: Medicare HMO | Admitting: Internal Medicine

## 2020-04-05 DIAGNOSIS — E559 Vitamin D deficiency, unspecified: Secondary | ICD-10-CM

## 2020-04-05 MED ORDER — VITAMIN D (ERGOCALCIFEROL) 1.25 MG (50000 UNIT) PO CAPS: 50000.0000 [IU] | ORAL_CAPSULE | ORAL | 1 refills | Status: AC

## 2020-04-05 NOTE — Progress Notes (Signed)
Vit  

## 2020-04-05 NOTE — Telephone Encounter (Signed)
Vit D. 50000 UI was sent to pharmacy.  KP

## 2020-04-05 NOTE — Telephone Encounter (Signed)
Pt called and and states she DOES want the vitamin D called into her pharmacy.  [place], [place]

## 2020-04-16 ENCOUNTER — Other Ambulatory Visit: Payer: Self-pay | Admitting: Internal Medicine

## 2020-04-16 DIAGNOSIS — M546 Pain in thoracic spine: Secondary | ICD-10-CM

## 2020-04-16 MED ORDER — BACLOFEN 10 MG PO TABS: 10.0000 mg | ORAL_TABLET | Freq: Three times a day (TID) | ORAL | 0 refills | Status: DC

## 2020-06-30 ENCOUNTER — Other Ambulatory Visit: Payer: Self-pay | Admitting: Internal Medicine

## 2020-06-30 DIAGNOSIS — I10 Essential (primary) hypertension: Secondary | ICD-10-CM

## 2020-06-30 NOTE — Telephone Encounter (Signed)
  Notes to clinic: Review script as directions are different from what's listed in chart    Requested Prescriptions  Pending Prescriptions Disp Refills   telmisartan (MICARDIS) 20 MG tablet [Pharmacy Med Name: Telmisartan 20 MG Oral Tablet] 21 tablet 0    Sig: TAKE 1 TABLET BY MOUTH ONCE DAILY (NEEDS  APPOINTMENT  FOR  FURTHER  REFILLS)      Cardiovascular:  Angiotensin Receptor Blockers Passed - 06/30/2020  3:01 PM      Passed - Cr in normal range and within 180 days    Creatinine  Date Value Ref Range Status  11/18/2012 1.04 0.60 - 1.30 mg/dL Final   Creatinine, Ser  Date Value Ref Range Status  04/02/2020 0.82 0.44 - 1.00 mg/dL Final          Passed - K in normal range and within 180 days    Potassium  Date Value Ref Range Status  04/02/2020 3.8 3.5 - 5.1 mmol/L Final  11/18/2012 4.3 3.5 - 5.1 mmol/L Final          Passed - Patient is not pregnant      Passed - Last BP in normal range    BP Readings from Last 1 Encounters:  04/02/20 124/78          Passed - Valid encounter within last 6 months    Recent Outpatient Visits           2 months ago Essential hypertension   [facility] Glean Hess, MD   1 year ago Essential hypertension   [facility] Glean Hess, MD   1 year ago Hydronephrosis, unspecified hydronephrosis type   [facility] Glean Hess, MD   2 years ago Acute cystitis without hematuria   [facility] Glean Hess, MD   2 years ago Essential hypertension   [facility] Glean Hess, MD

## 2020-07-06 ENCOUNTER — Other Ambulatory Visit: Payer: Self-pay | Admitting: Internal Medicine

## 2020-07-06 DIAGNOSIS — M546 Pain in thoracic spine: Secondary | ICD-10-CM

## 2020-07-06 NOTE — Telephone Encounter (Signed)
Requested medication (s) are due for refill today: no   Requested medication (s) are on the active medication list: yes   Last refill:  04/25/2020  Future visit scheduled: yes   Notes to clinic:  this refill cannot be delegated    Requested Prescriptions  Pending Prescriptions Disp Refills   baclofen (LIORESAL) 10 MG tablet [Pharmacy Med Name: Baclofen 10 MG Oral Tablet] 90 tablet 0    Sig: TAKE 1 TABLET BY MOUTH THREE TIMES DAILY      Not Delegated - Analgesics:  Muscle Relaxants Failed - 07/06/2020  3:22 PM      Failed - This refill cannot be delegated      Passed - Valid encounter within last 6 months    Recent Outpatient Visits           3 months ago Essential hypertension   [facility] Glean Hess, MD   1 year ago Essential hypertension   [facility] Glean Hess, MD   1 year ago Hydronephrosis, unspecified hydronephrosis type   [facility] Glean Hess, MD   2 years ago Acute cystitis without hematuria   [facility] Glean Hess, MD   2 years ago Essential hypertension   [facility] Glean Hess, MD

## 2020-07-10 IMAGING — MG MM DIGITAL SCREENING BILAT W/ TOMO W/ CAD
8 of 15 series · 8 of 40 positions shown · non-contrast
Comparison: Previous exam(s).

CLINICAL DATA: Screening.

EXAM:
DIGITAL SCREENING BILATERAL MAMMOGRAM WITH TOMO AND CAD

[L MLO synth-2D (1 of 2)]
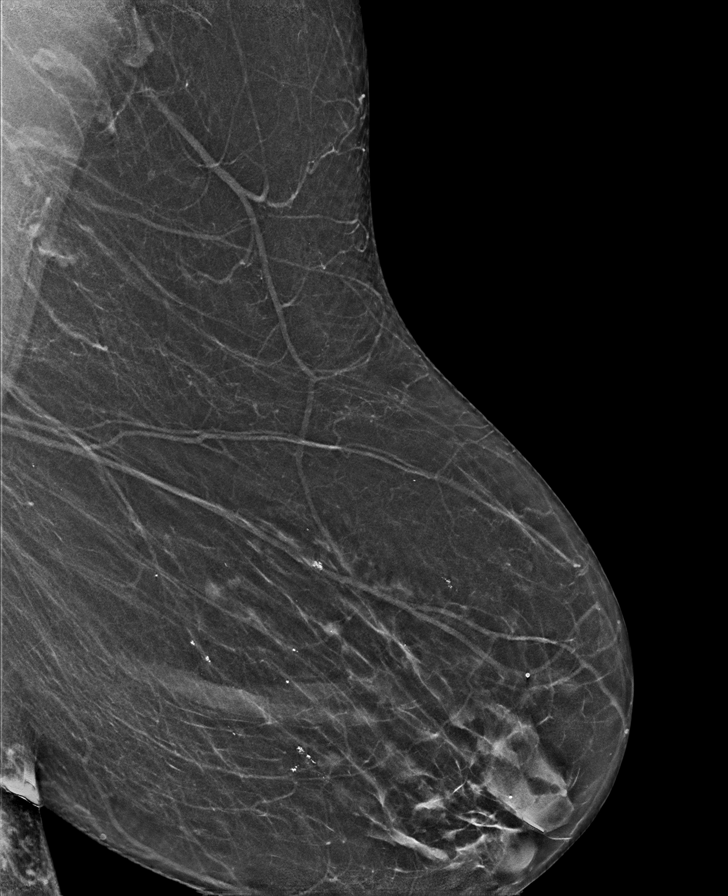

[L MLO synth-2D (2 of 2)]
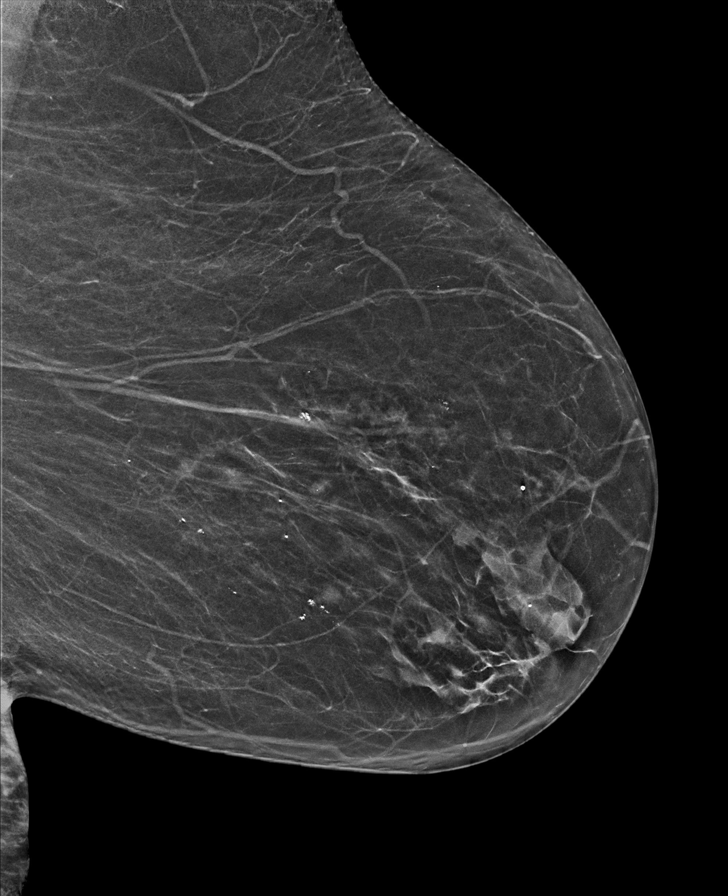

[R MLO synth-2D (1 of 2)]
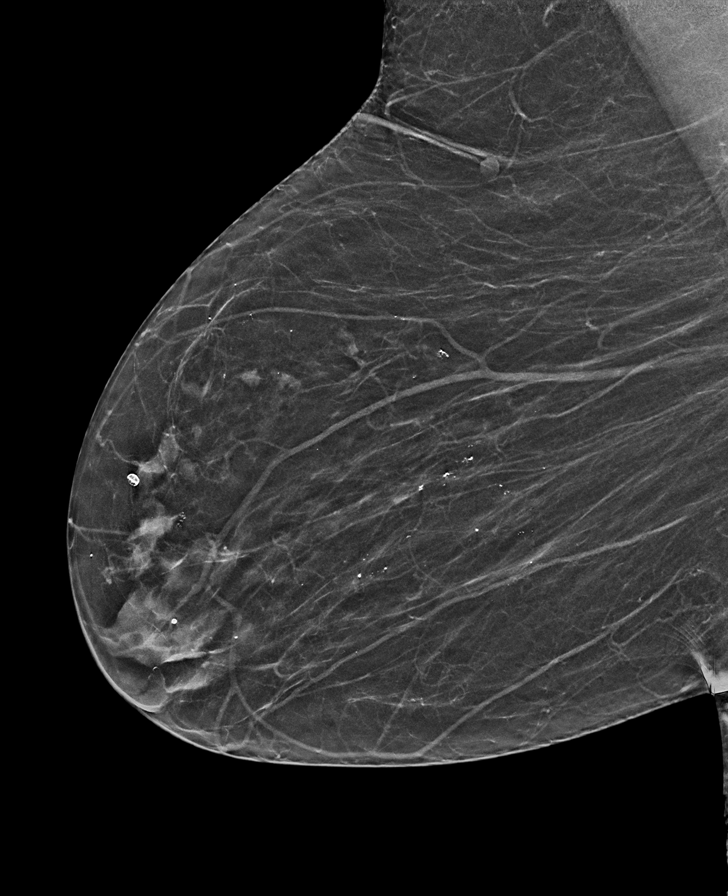

[R MLO synth-2D (2 of 2)]
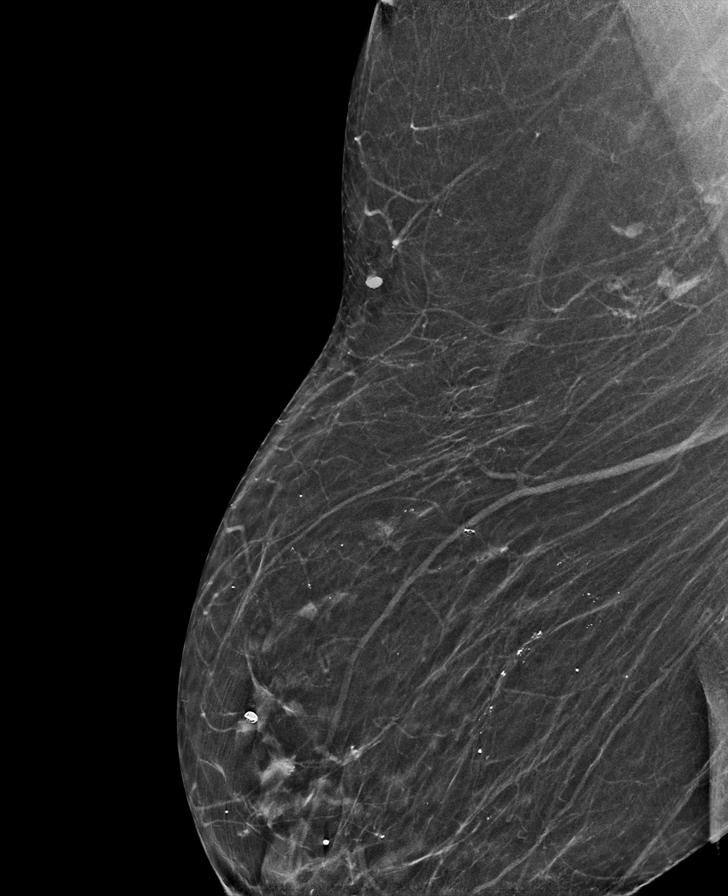

[R CC synth-2D]
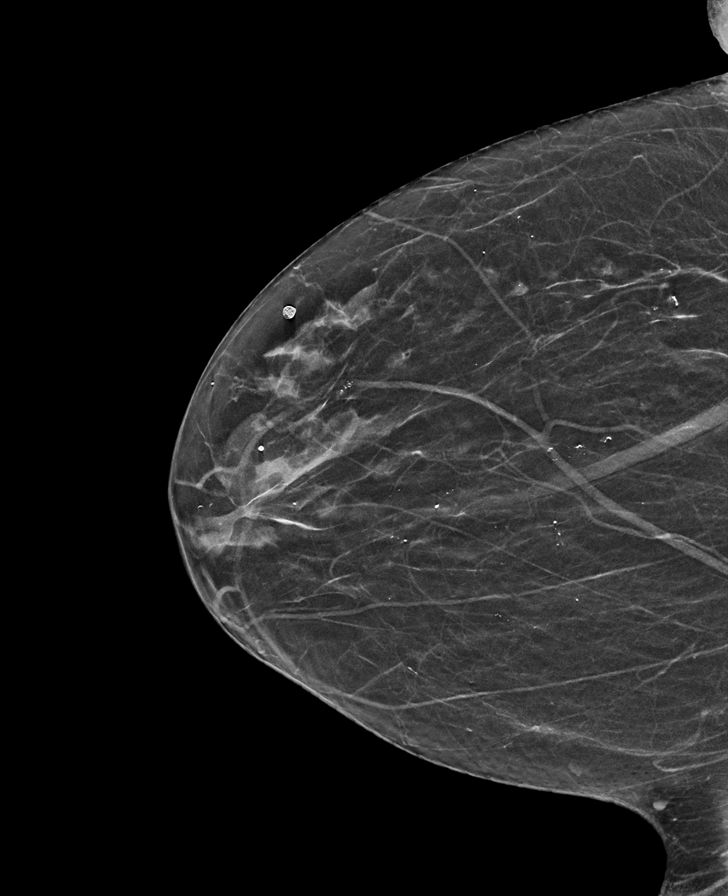

[L CC synth-2D (1 of 2)]
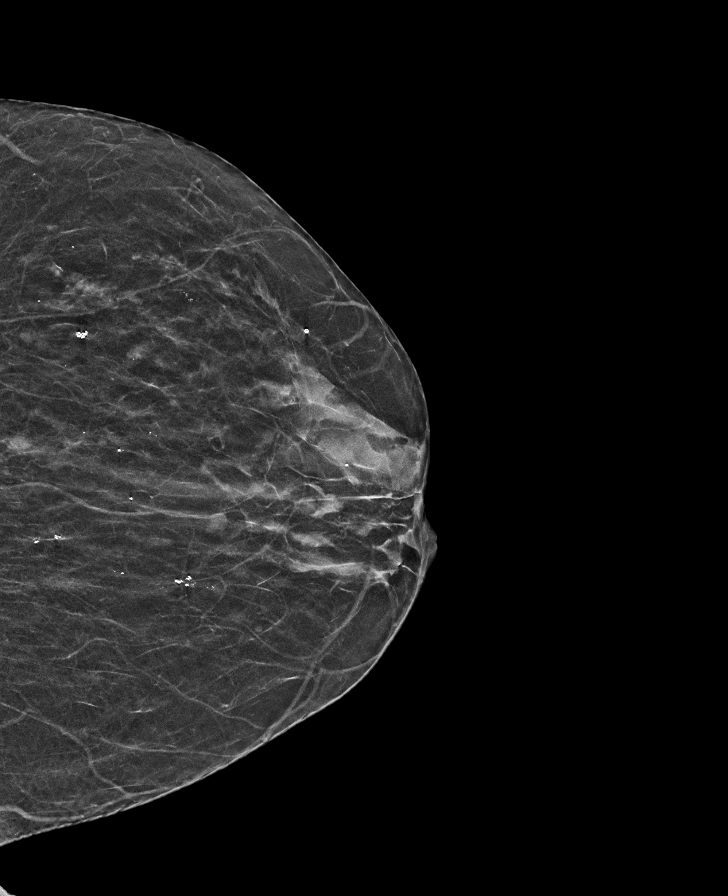

[L CC synth-2D (2 of 2)]
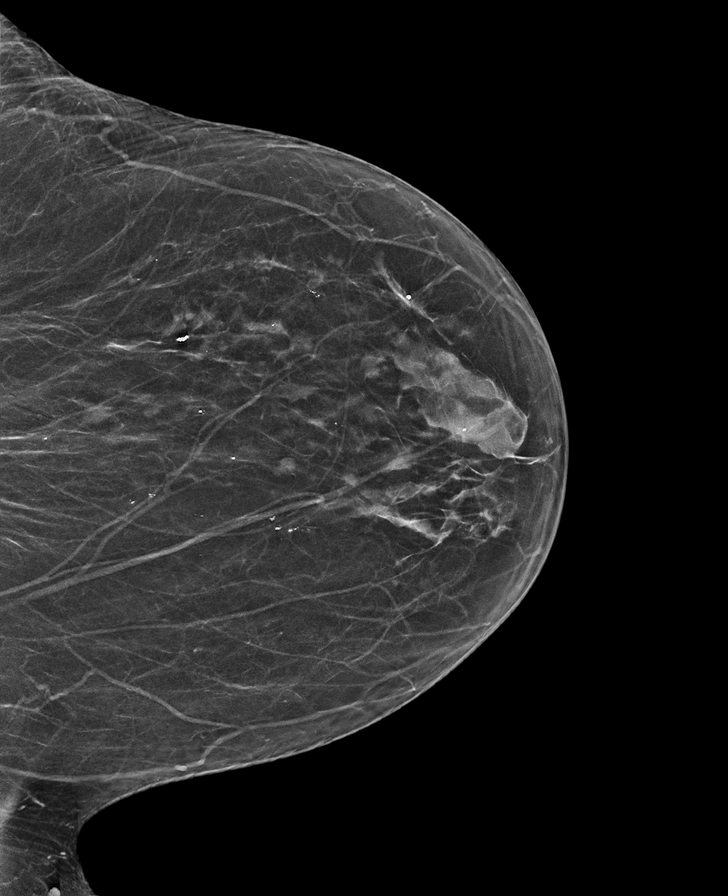

[L CC tomo · tomo slice 28/40.0]
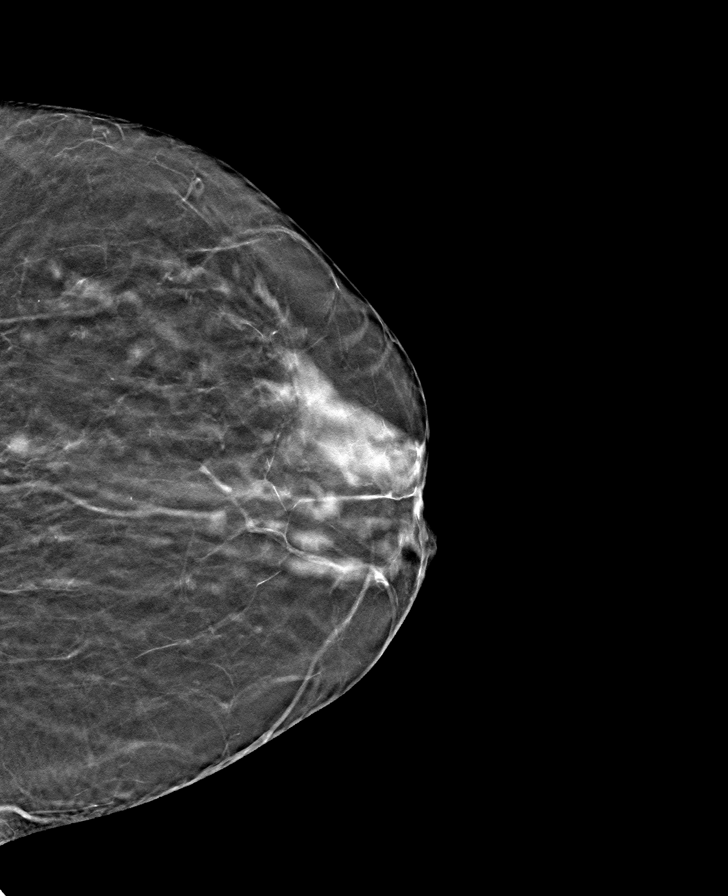

[8 of 40 positions shown; findings below may reference images not displayed]

ACR Breast Density Category b: There are scattered areas of
fibroglandular density.
FINDINGS: There are no findings suspicious for malignancy. Images were
processed with CAD.
IMPRESSION: No mammographic evidence of malignancy. A result letter of this
screening mammogram will be mailed directly to the patient.

RECOMMENDATION:
Screening mammogram in one year. (Code:[id number])

BI-RADS CATEGORY  1: Negative.

## 2020-08-12 ENCOUNTER — Ambulatory Visit: Payer: Self-pay | Admitting: *Deleted

## 2020-08-12 NOTE — Telephone Encounter (Signed)
Pt reports drop in BP at night only.  States last night 100/55, home monitor, wrist cuff. Checked during call, 149/92, HR 74. Reports "Twice in last month got very hot and dizzy, been drinking Pedialyte."  Reports dizziness, onset this past weekend,  at night only. Denies presently. Takes telmisartan '20mg'$  at HS, has not taken last 3 nights "Because I was dizzy and supposed my BP was low." Pt has only the one value from last night (100/55). States at HS HR 70-90.  Also takes Baclofen 3xdaily.  Pt called during lunch break, Noted appts on hold for Dr. Army Melia tomorrow. Assured pt NT would route to practice for PCPs review and final disposition. Advised ED if symptoms occur, worsen. Pt verbalizes understanding.   Please advise: CB# [phone]

## 2020-08-12 NOTE — Telephone Encounter (Signed)
Reason for Disposition  [id number] Systolic BP [id number] AND [id number] taking blood pressure medications AND [3] NOT dizzy, lightheaded or weak  Answer Assessment - Initial Assessment Questions 1. BLOOD PRESSURE: "What is the blood pressure?" "Did you take at least two measurements 5 minutes apart?"     Last night 100/60 2. ONSET: "When did you take your blood pressure?"     During call 149/92  HR 74 3. HOW: "How did you obtain the blood pressure?" (e.g., visiting nurse, automatic home BP monitor)    Wrist monitor 4. HISTORY: "Do you have a history of low blood pressure?" "What is your blood pressure normally?"     no 5. MEDICATIONS: "Are you taking any medications for blood pressure?" If Yes, ask: "Have they been changed recently?"     Yes Micardis 6. PULSE RATE: "Do you know what your pulse rate is?"      74 7. OTHER SYMPTOMS: "Have you been sick recently?" "Have you had a recent injury?"     "Hot, then chills at night"  Protocols used: Blood Pressure - Low-A-AH

## 2020-08-12 NOTE — Telephone Encounter (Signed)
Patient informed. Scheduled for next week. Will bring BP cuff at readings.

## 2020-08-18 ENCOUNTER — Encounter: Payer: Self-pay | Admitting: Internal Medicine

## 2020-08-18 ENCOUNTER — Other Ambulatory Visit
Admission: RE | Admit: 2020-08-18 | Discharge: 2020-08-18 | Disposition: A | Discharge status: Home / Self Care | Payer: Medicare HMO | Attending: Internal Medicine | Admitting: Internal Medicine

## 2020-08-18 ENCOUNTER — Other Ambulatory Visit: Payer: Self-pay

## 2020-08-18 ENCOUNTER — Ambulatory Visit (INDEPENDENT_AMBULATORY_CARE_PROVIDER_SITE_OTHER): Payer: Medicare HMO | Admitting: Internal Medicine

## 2020-08-18 VITALS — BP 74/60 | HR 144 | Temp 97.5°F | Ht 66.0 in | Wt 222.0 lb

## 2020-08-18 DIAGNOSIS — R739 Hyperglycemia, unspecified: Secondary | ICD-10-CM | POA: Diagnosis not present

## 2020-08-18 DIAGNOSIS — I959 Hypotension, unspecified: Secondary | ICD-10-CM | POA: Diagnosis not present

## 2020-08-18 DIAGNOSIS — I10 Essential (primary) hypertension: Secondary | ICD-10-CM

## 2020-08-18 LAB — BASIC METABOLIC PANEL
Anion gap: 7 (ref 5–15)
BUN: 27 mg/dL — ABNORMAL HIGH (ref 8–23)
CO2: 27 mmol/L (ref 22–32)
Calcium: 9.2 mg/dL (ref 8.9–10.3)
Chloride: 108 mmol/L (ref 98–111)
Creatinine, Ser: 1.07 mg/dL — ABNORMAL HIGH (ref 0.44–1.00)
GFR, Estimated: 57 mL/min — ABNORMAL LOW (ref >60)
Glucose, Bld: 125 mg/dL — ABNORMAL HIGH (ref 70–99)
Potassium: 3.7 mmol/L (ref 3.5–5.1)
Sodium: 142 mmol/L (ref 135–145)

## 2020-08-18 LAB — HEMOGLOBIN A1C
Hgb A1c MFr Bld: 5.3 % (ref 4.8–5.6)
Mean Plasma Glucose: 105.41 mg/dL

## 2020-08-18 NOTE — Progress Notes (Signed)
Date:  08/18/2020   Name:  Jeanette [name]   DOB:  October 18, 1950   MRN:  [id number]   Chief Complaint: Hypertension  Hypertension This is a chronic problem. The current episode started more than 1 year ago. The problem has been waxing and waning since onset. The problem is uncontrolled. Associated symptoms include malaise/fatigue, shortness of breath and sweats. Pertinent negatives include no chest pain. Risk factors for coronary artery disease include obesity. The current treatment provides no improvement. There are no compliance problems.  Orthostatic vitals were done during today's visit and patients BP dropped significantly standing. Her symptoms of pre-syncope occur when standing.  They started after she was out in the heat, shopping for several hours and was not hydrated.  Since then she has tried to drink more fluids but only consumes about 24 oz per day.  Orthostatic VS for the past 24 hrs:  BP- Lying Pulse- Lying BP- Sitting Pulse- Sitting BP- Standing at 0 minutes Pulse- Standing at 0 minutes  08/18/20 1354 118/70 125 120/68 137 (!) 74/60 144    Lab Results  Component Value Date   CREATININE 1.07 (H) 08/18/2020   BUN 27 (H) 08/18/2020   NA 142 08/18/2020   K 3.7 08/18/2020   CL 108 08/18/2020   CO2 27 08/18/2020   Lab Results  Component Value Date   CHOL 217 (H) 05/08/2019   HDL 82 05/08/2019   LDLCALC 122 (H) 05/08/2019   TRIG 66 05/08/2019   CHOLHDL 2.6 05/08/2019   Lab Results  Component Value Date   TSH 2.698 04/02/2020   No results found for: HGBA1C Lab Results  Component Value Date   WBC 5.0 04/02/2020   HGB 12.7 04/02/2020   HCT 39.6 04/02/2020   MCV 91.5 04/02/2020   PLT 310 04/02/2020   Lab Results  Component Value Date   ALT 14 04/02/2020   AST 20 04/02/2020   ALKPHOS 78 04/02/2020   BILITOT 0.3 04/02/2020    Review of Systems  Constitutional:  Positive for malaise/fatigue. Negative for chills, fatigue and fever.  HENT:  Negative for  trouble swallowing.   Respiratory:  Positive for shortness of breath. Negative for chest tightness.   Cardiovascular:  Negative for chest pain.  Neurological:  Positive for light-headedness. Negative for tremors, seizures and syncope.   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   Gait instability 07/24/2016   Urticaria 07/24/2016   External hemorrhoids 07/24/2016   Obesity (BMI 30-39.9) 07/24/2016    Allergies  Allergen Reactions   Macrobid [Nitrofurantoin] Shortness Of Breath and Nausea And Vomiting    Past Surgical History:  Procedure Laterality Date   ABDOMINAL HYSTERECTOMY     APPENDECTOMY     BREAST REDUCTION SURGERY Bilateral 01/28/2018   Procedure: MAMMARY REDUCTION  (BREAST);  Surgeon: Cristine Polio, MD;  Location: [place];  Service: Plastics;  Laterality: Bilateral;   CHOLECYSTECTOMY     COLONOSCOPY WITH PROPOFOL N/A 09/04/2016   Procedure: COLONOSCOPY WITH PROPOFOL;  Surgeon: Lucilla Lame, MD;  Location: [place];  Service: Gastroenterology;  Laterality: N/A;  requests early   GASTRIC BYPASS  06/20/2011   HERNIA REPAIR     POLYPECTOMY  09/04/2016   Procedure: POLYPECTOMY;  Surgeon: Lucilla Lame, MD;  Location: [place];  Service: Gastroenterology;;    Social History   Tobacco Use   Smoking status: Never   Smokeless tobacco: Never   Tobacco comments:    smoking cessation materials not required  Vaping Use   Vaping Use: Never used  Substance Use Topics   Alcohol use: No   Drug use: No     Medication list has been reviewed and updated.  Current Meds   Medication Sig   baclofen (LIORESAL) 10 MG tablet TAKE 1 TABLET BY MOUTH THREE TIMES DAILY   telmisartan (MICARDIS) 20 MG tablet Take 0.5 tablets (10 mg total) by mouth daily. (Patient taking differently: Take 10 mg by mouth daily. IF BP is LOW take 0.5 tablet, and if its high, take 1 whole tablet.)   Vitamin D, Ergocalciferol, (DRISDOL) 1.25 MG (50000 UNIT) CAPS capsule Take 1 capsule (50,000 Units total) by mouth every 7 (seven) days.    PHQ 2/9 Scores 08/18/2020 04/02/2020 11/12/2019 05/08/2019  PHQ - 2 Score 0 0 1 0  PHQ- 9 Score 3 0 - 0    GAD 7 : Generalized Anxiety Score 08/18/2020 04/02/2020 05/08/2019  Nervous, Anxious, on Edge '1 1 1  '$ Control/stop worrying 1 1 0  Worry too much - different things 1 1 0  Trouble relaxing 1 0 0  Restless 0 0 0  Easily annoyed or irritable 1 0 0  Afraid - awful might happen 1 0 0  Total GAD 7 Score '6 3 1  '$ Anxiety Difficulty - - Not difficult at all    Physical Exam Vitals and nursing note reviewed.  Constitutional:      General: She is not in acute distress.    Appearance: She is well-developed. She is obese.  HENT:     Head: Normocephalic and atraumatic.  Cardiovascular:     Rate and Rhythm: Normal rate and regular rhythm.     Pulses: Normal pulses.     Heart sounds: No murmur heard. Pulmonary:     Effort: Pulmonary effort is normal. No respiratory distress.     Breath sounds: No wheezing or rhonchi.  Musculoskeletal:     Cervical back: Normal range of motion.     Right lower leg: No edema.     Left lower leg: No edema.  Lymphadenopathy:     Cervical: No cervical adenopathy.  Skin:    General: Skin is warm and dry.     Findings: No rash.  Neurological:     Mental Status: She is alert and oriented to person, place, and time.  Psychiatric:        Mood and Affect: Mood normal.        Behavior: Behavior normal.    Wt Readings from Last 3 Encounters:  08/18/20 222 lb (100.7 kg)  04/02/20 231 lb (104.8 kg)  05/08/19 228 lb (103.4  kg)     BP (!) 74/60 (BP Location: Left Arm, Patient Position: Standing, Cuff Size: Large)   Pulse (!) 144   Temp (!) 97.5 F (36.4 C) (Oral)   Ht '5\' 6"'$  (1.676 m)   Wt 222 lb (100.7 kg)   SpO2 95%   BMI 35.83 kg/m   Assessment and Plan: 1. Essential hypertension Will reduce the dose to telmisartan to half and continue to monitor Follow up in 3 months if doing well.  Sooner if needed.  2. Symptomatic hypotension Likely due to chronic under-hydration worsened by the summer heat Encourage intake of 70-80 oz per day - water, electrolyte water, tea, etc - Basic metabolic panel  3. Hyperglycemia Noted on last labs Will check for new onset DM - Hemoglobin A1c   Partially dictated using Editor, commissioning. Any errors are unintentional.  Halina Maidens, MD [facility]  08/18/2020

## 2020-08-18 NOTE — Patient Instructions (Signed)
Take 1/2 telmisartan daily  Increase fluids to 70-80 oz per day.

## 2020-11-15 ENCOUNTER — Ambulatory Visit: Payer: Medicare HMO

## 2020-11-29 ENCOUNTER — Telehealth: Payer: Self-pay | Admitting: Internal Medicine

## 2020-11-29 ENCOUNTER — Telehealth: Payer: Self-pay

## 2020-11-29 NOTE — Telephone Encounter (Signed)
Medication Refill - Medication: diphenoxylate-atropine (LOMOTIL) 2.5-0.025 MG tablet  Has the patient contacted their pharmacy? Yes.   (Agent: If no, request that the patient contact the pharmacy for the refill. If patient does not wish to contact the pharmacy document the reason why and proceed with request.) (Agent: If yes, when and what did the pharmacy advise?)  Preferred Pharmacy (with phone number or street name):  [place], [place] - [address] [place] [address]  Phone: [phone] Fax: [phone]   Has the patient been seen for an appointment in the last year OR does the patient have an upcoming appointment? Yes.    Agent: Please be advised that RX refills may take up to 3 business days. We ask that you follow-up with your pharmacy.

## 2020-11-29 NOTE — Telephone Encounter (Signed)
Copied from [place] [id number]. Topic: Referral - Request for Referral >> Nov 29, 2020 11:42 AM Valere Dross wrote: Has patient seen PCP for this complaint? Yes.    Referral for which specialty: Mammography + Bone Density  Preferred provider/office: [place]  Reason for referral: Mammo+Bone density

## 2021-01-26 DIAGNOSIS — H1131 Conjunctival hemorrhage, right eye: Secondary | ICD-10-CM | POA: Diagnosis not present

## 2021-05-20 DIAGNOSIS — M545 Low back pain, unspecified: Secondary | ICD-10-CM | POA: Diagnosis not present

## 2021-05-20 DIAGNOSIS — M62830 Muscle spasm of back: Secondary | ICD-10-CM | POA: Diagnosis not present

## 2021-05-20 DIAGNOSIS — M9901 Segmental and somatic dysfunction of cervical region: Secondary | ICD-10-CM | POA: Diagnosis not present

## 2021-05-20 DIAGNOSIS — M9903 Segmental and somatic dysfunction of lumbar region: Secondary | ICD-10-CM | POA: Diagnosis not present

## 2021-06-01 DIAGNOSIS — M62830 Muscle spasm of back: Secondary | ICD-10-CM | POA: Diagnosis not present

## 2021-06-01 DIAGNOSIS — M545 Low back pain, unspecified: Secondary | ICD-10-CM | POA: Diagnosis not present

## 2021-06-01 DIAGNOSIS — M9903 Segmental and somatic dysfunction of lumbar region: Secondary | ICD-10-CM | POA: Diagnosis not present

## 2021-06-01 DIAGNOSIS — M9901 Segmental and somatic dysfunction of cervical region: Secondary | ICD-10-CM | POA: Diagnosis not present

## 2021-06-06 DIAGNOSIS — M62830 Muscle spasm of back: Secondary | ICD-10-CM | POA: Diagnosis not present

## 2021-06-06 DIAGNOSIS — M545 Low back pain, unspecified: Secondary | ICD-10-CM | POA: Diagnosis not present

## 2021-06-06 DIAGNOSIS — M9903 Segmental and somatic dysfunction of lumbar region: Secondary | ICD-10-CM | POA: Diagnosis not present

## 2021-06-06 DIAGNOSIS — M9901 Segmental and somatic dysfunction of cervical region: Secondary | ICD-10-CM | POA: Diagnosis not present

## 2021-06-13 DIAGNOSIS — M9903 Segmental and somatic dysfunction of lumbar region: Secondary | ICD-10-CM | POA: Diagnosis not present

## 2021-06-13 DIAGNOSIS — M9901 Segmental and somatic dysfunction of cervical region: Secondary | ICD-10-CM | POA: Diagnosis not present

## 2021-06-13 DIAGNOSIS — M545 Low back pain, unspecified: Secondary | ICD-10-CM | POA: Diagnosis not present

## 2021-06-13 DIAGNOSIS — M62830 Muscle spasm of back: Secondary | ICD-10-CM | POA: Diagnosis not present
